# Patient Record
Sex: Male | Born: 1960
Health system: Southern US, Community
[De-identification: ages and names within clinical notes are randomized; demographics above are authoritative.]

## PROBLEM LIST (undated history)

## (undated) DIAGNOSIS — Z6841 Body Mass Index (BMI) 40.0 and over, adult: Secondary | ICD-10-CM

## (undated) DIAGNOSIS — E785 Hyperlipidemia, unspecified: Secondary | ICD-10-CM

## (undated) DIAGNOSIS — M1990 Unspecified osteoarthritis, unspecified site: Secondary | ICD-10-CM

## (undated) DIAGNOSIS — G4733 Obstructive sleep apnea (adult) (pediatric): Secondary | ICD-10-CM

## (undated) DIAGNOSIS — E119 Type 2 diabetes mellitus without complications: Secondary | ICD-10-CM

## (undated) DIAGNOSIS — U071 COVID-19: Secondary | ICD-10-CM

## (undated) DIAGNOSIS — G4730 Sleep apnea, unspecified: Secondary | ICD-10-CM

## (undated) DIAGNOSIS — M25562 Pain in left knee: Secondary | ICD-10-CM

## (undated) DIAGNOSIS — I10 Essential (primary) hypertension: Secondary | ICD-10-CM

## (undated) DIAGNOSIS — Z9989 Dependence on other enabling machines and devices: Secondary | ICD-10-CM

## (undated) DIAGNOSIS — I890 Lymphedema, not elsewhere classified: Secondary | ICD-10-CM

## (undated) DIAGNOSIS — D649 Anemia, unspecified: Secondary | ICD-10-CM

## (undated) HISTORY — DX: Lymphedema, not elsewhere classified: I89.0

## (undated) HISTORY — DX: COVID-19: U07.1

## (undated) HISTORY — DX: Hyperlipidemia, unspecified: E78.5

## (undated) HISTORY — DX: Morbid (severe) obesity due to excess calories: E66.01

## (undated) HISTORY — PX: OTHER SURGICAL HISTORY: SHX169

## (undated) HISTORY — PX: UMBILICAL HERNIA REPAIR: SHX196

## (undated) HISTORY — DX: Obstructive sleep apnea (adult) (pediatric): Z99.89

## (undated) HISTORY — DX: Pain in left knee: M25.562

## (undated) HISTORY — DX: Essential (primary) hypertension: I10

## (undated) HISTORY — DX: Body Mass Index (BMI) 40.0 and over, adult: Z684

## (undated) HISTORY — DX: Unspecified osteoarthritis, unspecified site: M19.90

## (undated) HISTORY — DX: Anemia, unspecified: D64.9

## (undated) HISTORY — DX: Obstructive sleep apnea (adult) (pediatric): G47.33

## (undated) HISTORY — DX: Sleep apnea, unspecified: G47.30

## (undated) HISTORY — PX: HERNIA REPAIR: SHX51

---

## 2016-09-05 ENCOUNTER — Telehealth: Payer: Self-pay | Admitting: *Deleted

## 2016-09-05 ENCOUNTER — Encounter: Payer: Self-pay | Admitting: Family Medicine

## 2016-09-05 ENCOUNTER — Ambulatory Visit (INDEPENDENT_AMBULATORY_CARE_PROVIDER_SITE_OTHER): Payer: 59 | Admitting: Family Medicine

## 2016-09-05 VITALS — BP 126/78 | HR 86 | Temp 98.3°F | Ht 69.0 in | Wt 353.2 lb

## 2016-09-05 DIAGNOSIS — Z6841 Body Mass Index (BMI) 40.0 and over, adult: Secondary | ICD-10-CM

## 2016-09-05 DIAGNOSIS — Z9989 Dependence on other enabling machines and devices: Secondary | ICD-10-CM

## 2016-09-05 DIAGNOSIS — I499 Cardiac arrhythmia, unspecified: Secondary | ICD-10-CM

## 2016-09-05 DIAGNOSIS — Z0000 Encounter for general adult medical examination without abnormal findings: Secondary | ICD-10-CM | POA: Diagnosis not present

## 2016-09-05 DIAGNOSIS — Z0001 Encounter for general adult medical examination with abnormal findings: Secondary | ICD-10-CM

## 2016-09-05 DIAGNOSIS — G4733 Obstructive sleep apnea (adult) (pediatric): Secondary | ICD-10-CM | POA: Insufficient documentation

## 2016-09-05 DIAGNOSIS — I10 Essential (primary) hypertension: Secondary | ICD-10-CM | POA: Insufficient documentation

## 2016-09-05 LAB — CBC
HCT: 47.3 % (ref 39.0–52.0)
Hemoglobin: 16.1 g/dL (ref 13.0–17.0)
MCHC: 34 g/dL (ref 30.0–36.0)
MCV: 91.5 fl (ref 78.0–100.0)
Platelets: 306 10*3/uL (ref 150.0–400.0)
RBC: 5.17 Mil/uL (ref 4.22–5.81)
RDW: 13.5 % (ref 11.5–15.5)
WBC: 8.1 10*3/uL (ref 4.0–10.5)

## 2016-09-05 LAB — COMPREHENSIVE METABOLIC PANEL
ALT: 37 U/L (ref 0–53)
AST: 23 U/L (ref 0–37)
Albumin: 4.3 g/dL (ref 3.5–5.2)
Alkaline Phosphatase: 44 U/L (ref 39–117)
BUN: 18 mg/dL (ref 6–23)
CO2: 27 mEq/L (ref 19–32)
Calcium: 9.5 mg/dL (ref 8.4–10.5)
Chloride: 105 mEq/L (ref 96–112)
Creatinine, Ser: 0.91 mg/dL (ref 0.40–1.50)
GFR: 91.65 mL/min (ref >60.00)
Glucose, Bld: 126 mg/dL — ABNORMAL HIGH (ref 70–99)
Potassium: 4.4 mEq/L (ref 3.5–5.1)
Sodium: 138 mEq/L (ref 135–145)
Total Bilirubin: 0.5 mg/dL (ref 0.2–1.2)
Total Protein: 6.7 g/dL (ref 6.0–8.3)

## 2016-09-05 LAB — LIPID PANEL
Cholesterol: 171 mg/dL (ref 0–200)
HDL: 37.1 mg/dL — ABNORMAL LOW (ref >39.00)
LDL Cholesterol: 111 mg/dL — ABNORMAL HIGH (ref 0–99)
NonHDL: 134.09
Total CHOL/HDL Ratio: 5
Triglycerides: 113 mg/dL (ref 0.0–149.0)
VLDL: 22.6 mg/dL (ref 0.0–40.0)

## 2016-09-05 LAB — TSH: TSH: 1.59 u[IU]/mL (ref 0.35–4.50)

## 2016-09-05 LAB — HEMOGLOBIN A1C: Hgb A1c MFr Bld: 6.2 % (ref 4.6–6.5)

## 2016-09-05 MED ORDER — LISINOPRIL 20 MG PO TABS: 20.0000 mg | ORAL_TABLET | Freq: Every day | ORAL | 3 refills | Status: DC

## 2016-09-05 NOTE — Telephone Encounter (Signed)
Refill sent.

## 2016-09-05 NOTE — Assessment & Plan Note (Signed)
Declined immunizations. Colonoscopy up-to-date. Labs today. Discussed diet and exercise and need for weight loss. Also briefly discussed bariatric surgery. Handout given. **Irregular heartbeat noted on exam. As a result, EKG was obtained and revealed PVCs.

## 2016-09-05 NOTE — Progress Notes (Signed)
Pre visit review using our clinic review tool, if applicable. No additional management support is needed unless otherwise documented below in the visit note. 

## 2016-09-05 NOTE — Patient Instructions (Signed)
When you labs return we can discuss weight loss medication (to ensure that your thyroid is not playing a role).  Consider bariatric surgery.  Take care  Dr. Adriana Simas    Health Maintenance, Male A healthy lifestyle and preventive care is important for your health and wellness. Ask your health care provider about what schedule of regular examinations is right for you. What should I know about weight and diet?  Eat a Healthy Diet  Eat plenty of vegetables, fruits, whole grains, low-fat dairy products, and lean protein.  Do not eat a lot of foods high in solid fats, added sugars, or salt. Maintain a Healthy Weight  Regular exercise can help you achieve or maintain a healthy weight. You should:  Do at least 150 minutes of exercise each week. The exercise should increase your heart rate and make you sweat (moderate-intensity exercise).  Do strength-training exercises at least twice a week. Watch Your Levels of Cholesterol and Blood Lipids  Have your blood tested for lipids and cholesterol every 5 years starting at 56 years of age. If you are at high risk for heart disease, you should start having your blood tested when you are 56 years old. You may need to have your cholesterol levels checked more often if:  Your lipid or cholesterol levels are high.  You are older than 56 years of age.  You are at high risk for heart disease. What should I know about cancer screening? Many types of cancers can be detected early and may often be prevented. Lung Cancer  You should be screened every year for lung cancer if:  You are a current smoker who has smoked for at least 30 years.  You are a former smoker who has quit within the past 15 years.  Talk to your health care provider about your screening options, when you should start screening, and how often you should be screened. Colorectal Cancer  Routine colorectal cancer screening usually begins at 56 years of age and should be repeated every  5-10 years until you are 56 years old. You may need to be screened more often if early forms of precancerous polyps or small growths are found. Your health care provider may recommend screening at an earlier age if you have risk factors for colon cancer.  Your health care provider may recommend using home test kits to check for hidden blood in the stool.  A small camera at the end of a tube can be used to examine your colon (sigmoidoscopy or colonoscopy). This checks for the earliest forms of colorectal cancer. Prostate and Testicular Cancer  Depending on your age and overall health, your health care provider may do certain tests to screen for prostate and testicular cancer.  Talk to your health care provider about any symptoms or concerns you have about testicular or prostate cancer. Skin Cancer  Check your skin from head to toe regularly.  Tell your health care provider about any new moles or changes in moles, especially if:  There is a change in a mole's size, shape, or color.  You have a mole that is larger than a pencil eraser.  Always use sunscreen. Apply sunscreen liberally and repeat throughout the day.  Protect yourself by wearing long sleeves, pants, a wide-brimmed hat, and sunglasses when outside. What should I know about heart disease, diabetes, and high blood pressure?  If you are 68-87 years of age, have your blood pressure checked every 3-5 years. If you are 3 years of age  or older, have your blood pressure checked every year. You should have your blood pressure measured twice-once when you are at a hospital or clinic, and once when you are not at a hospital or clinic. Record the average of the two measurements. To check your blood pressure when you are not at a hospital or clinic, you can use:  An automated blood pressure machine at a pharmacy.  A home blood pressure monitor.  Talk to your health care provider about your target blood pressure.  If you are between  34-63 years old, ask your health care provider if you should take aspirin to prevent heart disease.  Have regular diabetes screenings by checking your fasting blood sugar level.  If you are at a normal weight and have a low risk for diabetes, have this test once every three years after the age of 57.  If you are overweight and have a high risk for diabetes, consider being tested at a younger age or more often.  A one-time screening for abdominal aortic aneurysm (AAA) by ultrasound is recommended for men aged 53-75 years who are current or former smokers. What should I know about preventing infection? Hepatitis B  If you have a higher risk for hepatitis B, you should be screened for this virus. Talk with your health care provider to find out if you are at risk for hepatitis B infection. Hepatitis C  Blood testing is recommended for:  Everyone born from [birth year] through 1965.  Anyone with known risk factors for hepatitis C. Sexually Transmitted Diseases (STDs)  You should be screened each year for STDs including gonorrhea and chlamydia if:  You are sexually active and are younger than 57 years of age.  You are older than 56 years of age and your health care provider tells you that you are at risk for this type of infection.  Your sexual activity has changed since you were last screened and you are at an increased risk for chlamydia or gonorrhea. Ask your health care provider if you are at risk.  Talk with your health care provider about whether you are at high risk of being infected with HIV. Your health care provider may recommend a prescription medicine to help prevent HIV infection. What else can I do?  Schedule regular health, dental, and eye exams.  Stay current with your vaccines (immunizations).  Do not use any tobacco products, such as cigarettes, chewing tobacco, and e-cigarettes. If you need help quitting, ask your health care provider.  Limit alcohol intake to no more than 2  drinks per day. One drink equals 12 ounces of beer, 5 ounces of wine, or 1 ounces of hard liquor.  Do not use street drugs.  Do not share needles.  Ask your health care provider for help if you need support or information about quitting drugs.  Tell your health care provider if you often feel depressed.  Tell your health care provider if you have ever been abused or do not feel safe at home. This information is not intended to replace advice given to you by your health care provider. Make sure you discuss any questions you have with your health care provider. Document Released: 11/16/2007 Document Revised: 01/17/2016 Document Reviewed: 02/21/2015 Elsevier Interactive Patient Education  2017 [employer].

## 2016-09-05 NOTE — Telephone Encounter (Signed)
Requested medication refill for :Lisinopril  Pharmacy:[facility] [address]  Please Contact Pt when ready or sent to Pharmacy:   [phone]

## 2016-09-05 NOTE — Progress Notes (Signed)
Subjective:  Patient ID: John Orozco, male    DOB: [birth year]  Age: 56 y.o. MRN: [id number]  CC: Establish care/physical  HPI John Orozco is a 56 y.o. male presents to the clinic today to establish care. He is in need of a physical.  Preventative Healthcare  Colonoscopy: Up to date. Done in 2015.  Immunizations  Tetanus - Declines.  Pneumococcal - Not indicated.  Flu - Declines.  Zoster - Candidate for.  Hepatitis C screening - Candidate for.  Labs: Labs today.  Alcohol use: No.  Smoking/tobacco use: No.  PMH, Surgical Hx, Family Hx, Social History reviewed and updated as below.  Past Medical History:  Diagnosis Date  . Essential hypertension   . Morbid obesity with BMI of 50.0-59.9, adult (HCC)   . OSA on CPAP    Past Surgical History:  Procedure Laterality Date  . UMBILICAL HERNIA REPAIR     Family History  Problem Relation Age of Onset  . Hypertension Mother   . Heart disease Mother    Social History  Substance Use Topics  . Smoking status: Never Smoker  . Smokeless tobacco: Never Used  . Alcohol use No   Review of Systems General: Denies unexplained weight loss, fever. Skin: Denies new or changing mole, sore/wound that won't heal. ENT: Trouble hearing, ringing in the ears, sores in the mouth, hoarseness, trouble swallowing. Eyes: Denies trouble seeing/visual disturbance. Heart/CV: Denies chest pain, shortness of breath, edema, palpitations. Lungs/Resp: Denies cough, shortness of breath, hemoptysis. Abd/GI: Denies nausea, vomiting, diarrhea, constipation, abdominal pain, hematochezia, melena. GU: Denies dysuria, incontinence, hematuria, urinary frequency, difficulty starting/keeping stream, penile discharge, sexual difficulty, lump in testicles. MSK: Denies joint pain/swelling, myalgias. Neuro: Denies headaches, weakness, numbness, dizziness, syncope. Psych: Denies sadness, anxiety, stress, memory difficulty. Endocrine: Denies polyuria and  polydipsia.  Objective:   Today's Vitals: BP 126/78   Pulse 86   Temp 98.3 F (36.8 C) (Oral)   Ht  (1.753 m)   Wt (!) 353 lb 4 oz (160.2 kg)   SpO2 97%   BMI 52.17 kg/m   Physical Exam  Constitutional: He is oriented to person, place, and time. He appears well-developed. No distress.  Morbidly obese.  HENT:  Head: Normocephalic and atraumatic.  Mouth/Throat: Oropharynx is clear and moist.  Eyes: Conjunctivae are normal.  Neck: Neck supple.  Cardiovascular: Normal rate.   Irregular.  Pulmonary/Chest: Effort normal and breath sounds normal. He has no wheezes. He has no rales.  Abdominal: Soft. He exhibits no distension. There is no tenderness.  Musculoskeletal: Normal range of motion.  Neurological: He is alert and oriented to person, place, and time.  Skin:  Hyperpigmentation lower extremities consistent with chronic venous insufficiency.  Psychiatric: He has a normal mood and affect.  Vitals reviewed.  Assessment & Plan:   Problem List Items Addressed This Visit    Encounter for health maintenance examination with abnormal findings - Primary    Declined immunizations. Colonoscopy up-to-date. Labs today. Discussed diet and exercise and need for weight loss. Also briefly discussed bariatric surgery. Handout given. **Irregular heartbeat noted on exam. As a result, EKG was obtained and revealed PVCs.      Relevant Orders   CBC   Hemoglobin A1c   Comprehensive metabolic panel   Lipid panel   TSH    Other Visit Diagnoses    Irregular heartbeat       Relevant Orders   EKG 12-Lead (Completed)     Follow-up: 6 months to 1 year  [place]

## 2016-11-19 ENCOUNTER — Encounter: Payer: Self-pay | Admitting: Family Medicine

## 2016-11-19 ENCOUNTER — Ambulatory Visit (INDEPENDENT_AMBULATORY_CARE_PROVIDER_SITE_OTHER): Payer: 59 | Admitting: Family Medicine

## 2016-11-19 DIAGNOSIS — I10 Essential (primary) hypertension: Secondary | ICD-10-CM | POA: Diagnosis not present

## 2016-11-19 DIAGNOSIS — R0981 Nasal congestion: Secondary | ICD-10-CM | POA: Diagnosis not present

## 2016-11-19 MED ORDER — LISINOPRIL 20 MG PO TABS: 30.0000 mg | ORAL_TABLET | Freq: Every day | ORAL | 3 refills | Status: DC

## 2016-11-19 MED ORDER — FLUTICASONE PROPIONATE 50 MCG/ACT NA SUSP: 2.0000 | Freq: Every day | NASAL | 6 refills | Status: DC

## 2016-11-19 MED ORDER — CETIRIZINE HCL 10 MG PO TABS: 10.0000 mg | ORAL_TABLET | Freq: Every day | ORAL | 11 refills | Status: DC

## 2016-11-19 NOTE — Assessment & Plan Note (Signed)
New problem. Likely allergic or viral. Flonase and Zyrtec.

## 2016-11-19 NOTE — Patient Instructions (Signed)
Lisinopril 30 mg daily (1.5 tablets).  Flonase and Zyrtec for allergy.  Lab in 7-10 days.  Follow up in 3-6 months.  Take care  Dr. Adriana Simasook

## 2016-11-19 NOTE — Progress Notes (Signed)
Subjective:  Patient ID: John Orozco, male    DOB: [birth year]  Age: 56 y.o. MRN: [id number]  CC: Elevated BP, allergies  HPI:  56 year old male with hypertension, OSA, morbid obesity presents with the above complaints.  Hypertension  Patient reports that his blood pressure has recently been elevated. The highest he has seen was 151/100.  He endorses compliance with his lisinopril.  He states his blood pressure seems to be worse in the evening.  He states he uses a home arm cuff.  He thinks he needs an increase in his medication.  Allergies  Patient reports recent sinus congestion, watery eyes.  No known inciting factor.  No medications or interventions tried.  No known exacerbating or relieving factors.  Mild to moderate in severity.  No associated fevers or chills.  He believes that this is allergic in nature.  Social Hx   Social History   Social History  . Marital status: Married    Spouse name: N/A  . Number of children: N/A  . Years of education: N/A   Social History Main Topics  . Smoking status: Never Smoker  . Smokeless tobacco: Never Used  . Alcohol use No  . Drug use: No  . Sexual activity: Yes    Partners: Female   Other Topics Concern  . None   Social History Narrative  . None    Review of Systems  HENT: Positive for congestion and sinus pressure.   Eyes:       Eye watering.   Objective:  BP 128/78 (BP Location: Right Arm, Cuff Size: Large)   Pulse 91   Temp 98.7 F (37.1 C) (Oral)   Resp 12   Wt (!) 353 lb 6.4 oz (160.3 kg)   SpO2 98%   BMI 52.19 kg/m   BP/Weight 11/19/2016 09/05/2016  Systolic BP 128 126  Diastolic BP 78 78  Wt. (Lbs) 353.4 353.25  BMI 52.19 52.17   Physical Exam  Constitutional: He is oriented to person, place, and time. He appears well-developed. No distress.  HENT:  Mouth/Throat: Oropharynx is clear and moist.  Eyes: Conjunctivae are normal. Right eye exhibits no discharge. Left eye exhibits no  discharge.  Cardiovascular: Normal rate and regular rhythm.   No murmur heard. Pulmonary/Chest: Effort normal. He has no wheezes. He has no rales.  Neurological: He is alert and oriented to person, place, and time.  Psychiatric: He has a normal mood and affect.  Vitals reviewed.   Lab Results  Component Value Date   WBC 8.1 09/05/2016   HGB 16.1 09/05/2016   HCT 47.3 09/05/2016   PLT 306.0 09/05/2016   GLUCOSE 126 (H) 09/05/2016   CHOL 171 09/05/2016   TRIG 113.0 09/05/2016   HDL 37.10 (L) 09/05/2016   LDLCALC 111 (H) 09/05/2016   ALT 37 09/05/2016   AST 23 09/05/2016   NA 138 09/05/2016   K 4.4 09/05/2016   CL 105 09/05/2016   CREATININE 0.91 09/05/2016   BUN 18 09/05/2016   CO2 27 09/05/2016   TSH 1.59 09/05/2016   HGBA1C 6.2 09/05/2016    Assessment & Plan:   Problem List Items Addressed This Visit    Sinus congestion    New problem. Likely allergic or viral. Flonase and Zyrtec.      Essential hypertension    Elevated at home per patient. BP at goal today. Increasing Lisinopril to 30 mg daily (per patient preference).      Relevant Medications   aspirin 81 MG  chewable tablet   lisinopril (PRINIVIL,ZESTRIL) 20 MG tablet      Meds ordered this encounter  Medications  . aspirin 81 MG chewable tablet    Sig: Chew 81 mg by mouth daily.  Marland Kitchen lisinopril (PRINIVIL,ZESTRIL) 20 MG tablet    Sig: Take 1.5 tablets (30 mg total) by mouth daily.    Dispense:  135 tablet    Refill:  3  . fluticasone (FLONASE) 50 MCG/ACT nasal spray    Sig: Place 2 sprays into both nostrils daily.    Dispense:  16 g    Refill:  6  . cetirizine (ZYRTEC) 10 MG tablet    Sig: Take 1 tablet (10 mg total) by mouth daily.    Dispense:  30 tablet    Refill:  11    Follow-up: 3-6 months  Doil Kamara Adriana Simas DO [facility]

## 2016-11-19 NOTE — Assessment & Plan Note (Signed)
Elevated at home per patient. BP at goal today. Increasing Lisinopril to 30 mg daily (per patient preference).

## 2016-11-19 NOTE — Progress Notes (Signed)
Pre-visit discussion using our clinic review tool. No additional management support is needed unless otherwise documented below in the visit note.  

## 2017-04-11 ENCOUNTER — Ambulatory Visit: Payer: Self-pay

## 2017-04-11 NOTE — Telephone Encounter (Signed)
Pt given advice per protocol but wishes to see PCP. [facility]. Pt states he will see [facility]. Appt made 04/17/17 @ 0915 with Dr. Ermalene SearingBedsole.  Reason for Disposition . [1] Sinus congestion as part of a cold AND [2] present < 10 days  Answer Assessment - Initial Assessment Questions 1. LOCATION: "Where does it hurt?"      Underneath right earlobe, headache 2. ONSET: "When did the sinus pain start?"  (e.g., hours, days)      Monday 3. SEVERITY: "How bad is the pain?"   (Scale 1-10; mild, moderate or severe)   - MILD (1-3): doesn't interfere with normal activities    - MODERATE (4-7): interferes with normal activities (e.g., work or school) or awakens from sleep   - SEVERE (8-10): excruciating pain and patient unable to do any normal activities        4 constant headache that subsided with Ibuprofen  4. RECURRENT SYMPTOM: "Have you ever had sinus problems before?" If so, ask: "When was the last time?" and "What happened that time?"     H/o sinus infections pt not sure when States when the seasons change 5. NASAL CONGESTION: "Is the nose blocked?" If so, ask, "Can you open it or must you breathe through the mouth?"     Yes wakes him up coughing into the CPAP mask 6. NASAL DISCHARGE: "Do you have discharge from your nose?" If so ask, "What color?"     Yes  Didn't note what color 7. FEVER: "Do you have a fever?" If so, ask: "What is it, how was it measured, and when did it start?"      No fever 8. OTHER SYMPTOMS: "Do you have any other symptoms?" (e.g., sore throat, cough, earache, difficulty breathing)     Cough earache scratchy and dry throat 9. PREGNANCY: "Is there any chance you are pregnant?" "When was your last menstrual period?"     N/a  Protocols used: SINUS PAIN OR CONGESTION-A-AH

## 2017-04-17 ENCOUNTER — Ambulatory Visit: Payer: 59 | Admitting: Family Medicine

## 2017-06-10 ENCOUNTER — Ambulatory Visit: Payer: Self-pay | Admitting: *Deleted

## 2017-06-10 NOTE — Telephone Encounter (Signed)
Reason for Disposition . [1] Sinus congestion as part of a cold AND [2] present < 10 days  Answer Assessment - Initial Assessment Questions 1. LOCATION: "Where does it hurt?"      Scratchy and sore throat 2. ONSET: "When did the sinus pain start?"  (e.g., hours, days)      Friday 3. SEVERITY: "How bad is the pain?"   (Scale 1-10; mild, moderate or severe)   - MILD (1-3): doesn't interfere with normal activities    - MODERATE (4-7): interferes with normal activities (e.g., work or school) or awakens from sleep   - SEVERE (8-10): excruciating pain and patient unable to do any normal activities        No complaints of pain 4. RECURRENT SYMPTOM: "Have you ever had sinus problems before?" If so, ask: "When was the last time?" and "What happened that time?"      Yes, in November 5. NASAL CONGESTION: "Is the nose blocked?" If so, ask, "Can you open it or must you breathe through the mouth?"     Yes, has been using nasal decongestant 6. NASAL DISCHARGE: "Do you have discharge from your nose?" If so ask, "What color?"     Yes, clear 7. FEVER: "Do you have a fever?" If so, ask: "What is it, how was it measured, and when did it start?"      No  8. OTHER SYMPTOMS: "Do you have any other symptoms?" (e.g., sore throat, cough, earache, difficulty breathing)     Sore throat, cough, feeling tired  Protocols used: SINUS PAIN OR CONGESTION-A-AH  Pt having complaints of scratchy and sore throat since Friday. Pt states that he has been coughing up a lot of clear phlegm and has been feeling tired. Pt states he has not had a fever that he is aware of, but he has not checked his temperature. Pt has been taking Mucinex, Tylenol cold OTC meds and using nasal decongestant with little relief. Bethann Berkshirerisha, flow coordinator at office contacted to see if any availability for the pt to be seen this week by any provider at the office, but none available, so pt was advised [facility]. Appt  scheduled for 2/11 to establish care due to being a former pt of Dr. Adriana Simasook. Pt verbalized understanding.

## 2017-07-14 ENCOUNTER — Ambulatory Visit (INDEPENDENT_AMBULATORY_CARE_PROVIDER_SITE_OTHER): Payer: Self-pay | Admitting: Internal Medicine

## 2017-07-14 ENCOUNTER — Encounter: Payer: Self-pay | Admitting: Internal Medicine

## 2017-07-14 VITALS — BP 136/84 | HR 86 | Temp 98.0°F | Ht 70.0 in | Wt 359.6 lb

## 2017-07-14 DIAGNOSIS — Z13818 Encounter for screening for other digestive system disorders: Secondary | ICD-10-CM

## 2017-07-14 DIAGNOSIS — Z23 Encounter for immunization: Secondary | ICD-10-CM

## 2017-07-14 DIAGNOSIS — I890 Lymphedema, not elsewhere classified: Secondary | ICD-10-CM

## 2017-07-14 DIAGNOSIS — Z13220 Encounter for screening for lipoid disorders: Secondary | ICD-10-CM

## 2017-07-14 DIAGNOSIS — Z125 Encounter for screening for malignant neoplasm of prostate: Secondary | ICD-10-CM

## 2017-07-14 DIAGNOSIS — J321 Chronic frontal sinusitis: Secondary | ICD-10-CM

## 2017-07-14 DIAGNOSIS — Z9989 Dependence on other enabling machines and devices: Secondary | ICD-10-CM

## 2017-07-14 DIAGNOSIS — Z1329 Encounter for screening for other suspected endocrine disorder: Secondary | ICD-10-CM

## 2017-07-14 DIAGNOSIS — Z6841 Body Mass Index (BMI) 40.0 and over, adult: Secondary | ICD-10-CM

## 2017-07-14 DIAGNOSIS — R0981 Nasal congestion: Secondary | ICD-10-CM

## 2017-07-14 DIAGNOSIS — I10 Essential (primary) hypertension: Secondary | ICD-10-CM

## 2017-07-14 DIAGNOSIS — Z1159 Encounter for screening for other viral diseases: Secondary | ICD-10-CM

## 2017-07-14 DIAGNOSIS — G4733 Obstructive sleep apnea (adult) (pediatric): Secondary | ICD-10-CM

## 2017-07-14 DIAGNOSIS — R7303 Prediabetes: Secondary | ICD-10-CM

## 2017-07-14 MED ORDER — AZELASTINE HCL 0.15 % NA SOLN: 1.0000 | Freq: Every day | NASAL | 11 refills | Status: DC | PRN

## 2017-07-14 MED ORDER — SALINE SPRAY 0.65 % NA SOLN: 1.0000 | Freq: Every day | NASAL | 11 refills | Status: DC | PRN

## 2017-07-14 MED ORDER — CETIRIZINE HCL 10 MG PO TABS: 10.0000 mg | ORAL_TABLET | Freq: Every day | ORAL | 3 refills | Status: DC

## 2017-07-14 MED ORDER — FLUTICASONE PROPIONATE 50 MCG/ACT NA SUSP: 1.0000 | Freq: Every day | NASAL | 11 refills | Status: DC

## 2017-07-14 MED ORDER — LISINOPRIL 30 MG PO TABS
30.0000 mg | ORAL_TABLET | Freq: Every day | ORAL | 1 refills | Status: DC
Start: 2017-07-14 — End: 2018-03-05

## 2017-07-14 NOTE — Patient Instructions (Signed)
Please try  1. Nasal saline 2. Flonase 3 .Azestaline nasal sprays   Call back if you would like a CT scan of your sinuses  Please schedule fasting labs no food x 12 hours around 09/05/17 and f/u with me 1 week later  Take care   Sinusitis, Adult Sinusitis is soreness and inflammation of your sinuses. Sinuses are hollow spaces in the bones around your face. Your sinuses are located:  Around your eyes.  In the middle of your forehead.  Behind your nose.  In your cheekbones.  Your sinuses and nasal passages are lined with a stringy fluid (mucus). Mucus normally drains out of your sinuses. When your nasal tissues become inflamed or swollen, the mucus can become trapped or blocked so air cannot flow through your sinuses. This allows bacteria, viruses, and funguses to grow, which leads to infection. Sinusitis can develop quickly and last for 7?10 days (acute) or for more than 12 weeks (chronic). Sinusitis often develops after a cold. What are the causes? This condition is caused by anything that creates swelling in the sinuses or stops mucus from draining, including:  Allergies.  Asthma.  Bacterial or viral infection.  Abnormally shaped bones between the nasal passages.  Nasal growths that contain mucus (nasal polyps).  Narrow sinus openings.  Pollutants, such as chemicals or irritants in the air.  A foreign object stuck in the nose.  A fungal infection. This is rare.  What increases the risk? The following factors may make you more likely to develop this condition:  Having allergies or asthma.  Having had a recent cold or respiratory tract infection.  Having structural deformities or blockages in your nose or sinuses.  Having a weak immune system.  Doing a lot of swimming or diving.  Overusing nasal sprays.  Smoking.  What are the signs or symptoms? The main symptoms of this condition are pain and a feeling of pressure around the affected sinuses. Other symptoms  include:  Upper toothache.  Earache.  Headache.  Bad breath.  Decreased sense of smell and taste.  A cough that may get worse at night.  Fatigue.  Fever.  Thick drainage from your nose. The drainage is often green and it may contain pus (purulent).  Stuffy nose or congestion.  Postnasal drip. This is when extra mucus collects in the throat or back of the nose.  Swelling and warmth over the affected sinuses.  Sore throat.  Sensitivity to light.  How is this diagnosed? This condition is diagnosed based on symptoms, a medical history, and a physical exam. To find out if your condition is acute or chronic, your health care provider may:  Look in your nose for signs of nasal polyps.  Tap over the affected sinus to check for signs of infection.  View the inside of your sinuses using an imaging device that has a light attached (endoscope).  If your health care provider suspects that you have chronic sinusitis, you may also:  Be tested for allergies.  Have a sample of mucus taken from your nose (nasal culture) and checked for bacteria.  Have a mucus sample examined to see if your sinusitis is related to an allergy.  If your sinusitis does not respond to treatment and it lasts longer than 8 weeks, you may have an MRI or CT scan to check your sinuses. These scans also help to determine how severe your infection is. In rare cases, a bone biopsy may be done to rule out more serious types  of fungal sinus disease. How is this treated? Treatment for sinusitis depends on the cause and whether your condition is chronic or acute. If a virus is causing your sinusitis, your symptoms will go away on their own within 10 days. You may be given medicines to relieve your symptoms, including:  Topical nasal decongestants. They shrink swollen nasal passages and let mucus drain from your sinuses.  Antihistamines. These drugs block inflammation that is triggered by allergies. This can help  to ease swelling in your nose and sinuses.  Topical nasal corticosteroids. These are nasal sprays that ease inflammation and swelling in your nose and sinuses.  Nasal saline washes. These rinses can help to get rid of thick mucus in your nose.  If your condition is caused by bacteria, you will be given an antibiotic medicine. If your condition is caused by a fungus, you will be given an antifungal medicine. Surgery may be needed to correct underlying conditions, such as narrow nasal passages. Surgery may also be needed to remove polyps. Follow these instructions at home: Medicines  Take, use, or apply over-the-counter and prescription medicines only as told by your health care provider. These may include nasal sprays.  If you were prescribed an antibiotic medicine, take it as told by your health care provider. Do not stop taking the antibiotic even if you start to feel better. Hydrate and Humidify  Drink enough water to keep your urine clear or pale yellow. Staying hydrated will help to thin your mucus.  Use a cool mist humidifier to keep the humidity level in your home above 50%.  Inhale steam for 10-15 minutes, 3-4 times a day or as told by your health care provider. You can do this in the bathroom while a hot shower is running.  Limit your exposure to cool or dry air. Rest  Rest as much as possible.  Sleep with your head raised (elevated).  Make sure to get enough sleep each night. General instructions  Apply a warm, moist washcloth to your face 3-4 times a day or as told by your health care provider. This will help with discomfort.  Wash your hands often with soap and water to reduce your exposure to viruses and other germs. If soap and water are not available, use hand sanitizer.  Do not smoke. Avoid being around people who are smoking (secondhand smoke).  Keep all follow-up visits as told by your health care provider. This is important. Contact a health care provider  if:  You have a fever.  Your symptoms get worse.  Your symptoms do not improve within 10 days. Get help right away if:  You have a severe headache.  You have persistent vomiting.  You have pain or swelling around your face or eyes.  You have vision problems.  You develop confusion.  Your neck is stiff.  You have trouble breathing. This information is not intended to replace advice given to you by your health care provider. Make sure you discuss any questions you have with your health care provider. Document Released: 05/20/2005 Document Revised: 01/14/2016 Document Reviewed: 03/15/2015 Elsevier Interactive Patient Education  [employer].

## 2017-07-14 NOTE — Progress Notes (Signed)
Pre visit review using our clinic review tool, if applicable. No additional management support is needed unless otherwise documented below in the visit note. 

## 2017-07-15 ENCOUNTER — Encounter: Payer: Self-pay | Admitting: Internal Medicine

## 2017-07-15 DIAGNOSIS — I890 Lymphedema, not elsewhere classified: Secondary | ICD-10-CM | POA: Insufficient documentation

## 2017-07-15 NOTE — Progress Notes (Signed)
Chief Complaint  Patient presents with  . Follow-up    transfer from Dr. Adriana Simas   Follow u p 1. He saw urgent care ~10 days ago for sinusitis with sinus pressure frontal esp and feels like head filling up and eyes watery. He was given 10 days Augmentin, azestaline nasal. He still c/o PND and note being able to smell/breath out of right nose. This is his 2nd flare since 06/2017 and he did not get evaluated for the 06/2017 flare. He also reports ear pain related to sinus issues.  He has never been smoker no h/o allergies except 10 years ago he was allergic to rodents like gerbils/rats. He does have a 57 y.o hypoallergenic dog. He never tried [employer]. He feels slightly better but not 100% 2. HTN controlled on Lis 30 mg qd  3. Chronic lymphedema improved with cpap and compression stockings per pt  4. OSA on cpap and benefiting    Review of Systems  Constitutional: Negative for weight loss.  HENT: Positive for ear pain and sinus pain.        +PND  Eyes: Negative for blurred vision.  Respiratory: Negative for cough.   Cardiovascular:       H/o lymphedema controlled with compression stockings   Gastrointestinal: Negative for abdominal pain.  Musculoskeletal: Negative for falls.  Skin: Negative for rash.  Neurological: Positive for headaches.  Psychiatric/Behavioral: Negative for memory loss.   Past Medical History:  Diagnosis Date  . Essential hypertension   . Lymphedema   . Morbid obesity with BMI of 50.0-59.9, adult (HCC)   . OSA on CPAP    Past Surgical History:  Procedure Laterality Date  . UMBILICAL HERNIA REPAIR     Family History  Problem Relation Age of Onset  . Hypertension Mother   . Heart disease Mother    Social History   Socioeconomic History  . Marital status: Married    Spouse name: Not on file  . Number of children: Not on file  . Years of education: Not on file  . Highest education level: Not on file  Social Needs  . Financial resource strain: Not on file  .  Food insecurity - worry: Not on file  . Food insecurity - inability: Not on file  . Transportation needs - medical: Not on file  . Transportation needs - non-medical: Not on file  Occupational History  . Not on file  Tobacco Use  . Smoking status: Never Smoker  . Smokeless tobacco: Never Used  Substance and Sexual Activity  . Alcohol use: No  . Drug use: No  . Sexual activity: Yes    Partners: Female  Other Topics Concern  . Not on file  Social History Narrative   Married    Kids    Current Meds  Medication Sig  . aspirin 81 MG chewable tablet Chew 81 mg by mouth daily.  Marland Kitchen lisinopril (PRINIVIL,ZESTRIL) 30 MG tablet Take 1 tablet (30 mg total) by mouth daily.  . [DISCONTINUED] lisinopril (PRINIVIL,ZESTRIL) 20 MG tablet Take 1.5 tablets (30 mg total) by mouth daily.   No Known Allergies No results found for this or any previous visit (from the past 2160 hour(s)). Objective  Body mass index is 51.6 kg/m. Wt Readings from Last 3 Encounters:  07/14/17 (!) 359 lb 9.6 oz (163.1 kg)  11/19/16 (!) 353 lb 6.4 oz (160.3 kg)  09/05/16 (!) 353 lb 4 oz (160.2 kg)   Temp Readings from Last 3 Encounters:  07/14/17 98 F (  36.7 C) (Oral)  11/19/16 98.7 F (37.1 C) (Oral)  09/05/16 98.3 F (36.8 C) (Oral)   BP Readings from Last 3 Encounters:  07/14/17 136/84  11/19/16 128/78  09/05/16 126/78   Pulse Readings from Last 3 Encounters:  07/14/17 86  11/19/16 91  09/05/16 86   O2 sat room air 97%  Physical Exam  Constitutional: He is oriented to person, place, and time and well-developed, well-nourished, and in no distress. Vital signs are normal.  HENT:  Head: Normocephalic and atraumatic.  Nose: Right sinus exhibits frontal sinus tenderness. Left sinus exhibits frontal sinus tenderness.  Mouth/Throat: Oropharynx is clear and moist and mucous membranes are normal.  Eyes: Conjunctivae are normal. Pupils are equal, round, and reactive to light.  Cardiovascular: Normal rate,  regular rhythm and normal heart sounds.  Pulmonary/Chest: Effort normal and breath sounds normal.  Neurological: He is alert and oriented to person, place, and time. Gait normal. Gait normal.  Skin: Skin is warm, dry and intact.     Psychiatric: Mood, memory, affect and judgment normal.  Nursing note and vitals reviewed.   Assessment   1. Sinusitis somewhat improved  2. HM 3. Chronic lymphedema  4. OSA on cpap compliant and benefiting from cpap  5. HTN controlled on lis 30 qd  Plan   1.  Trial NS, flonase and azestaline nasal, cont zyrtec qhs  Consider singulair if sxs dont improve  Consider CT sinus if does not improve   2.  Given flu shot today  Tdap had <10 years ago ? When  Check hep B/C status  Will need to check labs CMET, CBC, lipid, UA, TSH, T4, A1C, PSA, hep B/C prior to next visit  Declines HIV testing  Disc exercise to lose wt and healthy diet choices  Never smoker  Colonoscopy had in [place] 5-6 years ago was told 10 years due in 4 years.   3.  Cont compression stockings 4. Cont cpap  5. Cont meds   Provider: Dr. French Anaracy McLean-Scocuzza-[facility]

## 2017-09-15 ENCOUNTER — Other Ambulatory Visit: Payer: 59

## 2018-03-05 ENCOUNTER — Other Ambulatory Visit: Payer: Self-pay | Admitting: Internal Medicine

## 2018-03-05 MED ORDER — LISINOPRIL 30 MG PO TABS: 30.0000 mg | ORAL_TABLET | Freq: Every day | ORAL | 0 refills | Status: DC

## 2018-03-05 NOTE — Telephone Encounter (Signed)
Courtesy refill given. Scheduled  Appt. for 04/17/18  Requested Prescriptions  Pending Prescriptions Disp Refills  . lisinopril (PRINIVIL,ZESTRIL) 30 MG tablet 90 tablet 0    Sig: Take 1 tablet (30 mg total) by mouth daily.     Cardiovascular:  ACE Inhibitors Failed - 03/05/2018  3:14 PM      Failed - Cr in normal range and within 180 days    Creatinine, Ser  Date Value Ref Range Status  09/05/2016 0.91 0.40 - 1.50 mg/dL Final         Failed - K in normal range and within 180 days    Potassium  Date Value Ref Range Status  09/05/2016 4.4 3.5 - 5.1 mEq/L Final         Failed - Valid encounter within last 6 months    Recent Outpatient Visits          7 months ago Frontal sinusitis, unspecified chronicity   [facility] [place] McLean-Scocuzza, Pasty Spillers, MD   1 year ago Essential hypertension   [facility] [place], [place], DO   1 year ago Encounter for health maintenance examination with abnormal findings   [facility] [place], Verdis Frederickson, DO      Future Appointments            In 1 month McLean-Scocuzza, Pasty Spillers, MD [facility], [facility]           Passed - Patient is not pregnant      Passed - Last BP in normal range    BP Readings from Last 1 Encounters:  07/14/17 136/84

## 2018-03-05 NOTE — Telephone Encounter (Signed)
Copied from CRM [id number]. Topic: Quick Communication - Rx Refill/Question >> Mar 05, 2018  3:09 PM Darletta Moll L wrote: Medication: lisinopril (PRINIVIL,ZESTRIL) 30 MG tablet  Has the patient contacted their pharmacy? Yes.   (Agent: If no, request that the patient contact the pharmacy for the refill.) (Agent: If yes, when and what did the pharmacy advise?)  Preferred Pharmacy (with phone number or street name): [facility] [address] [place] - [address] [place] [address] Phone: [phone] Fax: [phone]  Agent: Please be advised that RX refills may take up to 3 business days. We ask that you follow-up with your pharmacy.

## 2018-04-17 ENCOUNTER — Encounter: Payer: Self-pay | Admitting: Internal Medicine

## 2018-04-17 ENCOUNTER — Ambulatory Visit (INDEPENDENT_AMBULATORY_CARE_PROVIDER_SITE_OTHER): Payer: BLUE CROSS/BLUE SHIELD | Admitting: Internal Medicine

## 2018-04-17 VITALS — BP 140/86 | HR 97 | Temp 97.5°F | Ht 70.0 in | Wt 375.6 lb

## 2018-04-17 DIAGNOSIS — I10 Essential (primary) hypertension: Secondary | ICD-10-CM

## 2018-04-17 DIAGNOSIS — Z1329 Encounter for screening for other suspected endocrine disorder: Secondary | ICD-10-CM

## 2018-04-17 DIAGNOSIS — Z1389 Encounter for screening for other disorder: Secondary | ICD-10-CM

## 2018-04-17 DIAGNOSIS — Z125 Encounter for screening for malignant neoplasm of prostate: Secondary | ICD-10-CM

## 2018-04-17 DIAGNOSIS — Z23 Encounter for immunization: Secondary | ICD-10-CM

## 2018-04-17 DIAGNOSIS — E785 Hyperlipidemia, unspecified: Secondary | ICD-10-CM

## 2018-04-17 DIAGNOSIS — Z6841 Body Mass Index (BMI) 40.0 and over, adult: Secondary | ICD-10-CM

## 2018-04-17 DIAGNOSIS — E559 Vitamin D deficiency, unspecified: Secondary | ICD-10-CM

## 2018-04-17 DIAGNOSIS — Z13818 Encounter for screening for other digestive system disorders: Secondary | ICD-10-CM

## 2018-04-17 DIAGNOSIS — M25562 Pain in left knee: Secondary | ICD-10-CM | POA: Diagnosis not present

## 2018-04-17 DIAGNOSIS — J309 Allergic rhinitis, unspecified: Secondary | ICD-10-CM

## 2018-04-17 DIAGNOSIS — R7303 Prediabetes: Secondary | ICD-10-CM

## 2018-04-17 DIAGNOSIS — Z1159 Encounter for screening for other viral diseases: Secondary | ICD-10-CM

## 2018-04-17 MED ORDER — MONTELUKAST SODIUM 10 MG PO TABS: 10.0000 mg | ORAL_TABLET | Freq: Every day | ORAL | 0 refills | Status: DC

## 2018-04-17 MED ORDER — LISINOPRIL 30 MG PO TABS: 30.0000 mg | ORAL_TABLET | Freq: Every day | ORAL | 3 refills | Status: DC

## 2018-04-17 NOTE — Progress Notes (Signed)
Pre visit review using our clinic review tool, if applicable. No additional management support is needed unless otherwise documented below in the visit note. 

## 2018-04-17 NOTE — Patient Instructions (Signed)
Fasting labs asap  F/u in 6 months sooner if needed    Hypertension Hypertension, commonly called high blood pressure, is when the force of blood pumping through the arteries is too strong. The arteries are the blood vessels that carry blood from the heart throughout the body. Hypertension forces the heart to work harder to pump blood and may cause arteries to become narrow or stiff. Having untreated or uncontrolled hypertension can cause heart attacks, strokes, kidney disease, and other problems. A blood pressure reading consists of a higher number over a lower number. Ideally, your blood pressure should be below 120/80. The first ("top") number is called the systolic pressure. It is a measure of the pressure in your arteries as your heart beats. The second ("bottom") number is called the diastolic pressure. It is a measure of the pressure in your arteries as the heart relaxes. What are the causes? The cause of this condition is not known. What increases the risk? Some risk factors for high blood pressure are under your control. Others are not. Factors you can change  Smoking.  Having type 2 diabetes mellitus, high cholesterol, or both.  Not getting enough exercise or physical activity.  Being overweight.  Having too much fat, sugar, calories, or salt (sodium) in your diet.  Drinking too much alcohol. Factors that are difficult or impossible to change  Having chronic kidney disease.  Having a family history of high blood pressure.  Age. Risk increases with age.  Race. You may be at higher risk if you are African-American.  Gender. Men are at higher risk than women before age 41. After age 72, women are at higher risk than men.  Having obstructive sleep apnea.  Stress. What are the signs or symptoms? Extremely high blood pressure (hypertensive crisis) may cause:  Headache.  Anxiety.  Shortness of breath.  Nosebleed.  Nausea and vomiting.  Severe chest  pain.  Jerky movements you cannot control (seizures).  How is this diagnosed? This condition is diagnosed by measuring your blood pressure while you are seated, with your arm resting on a surface. The cuff of the blood pressure monitor will be placed directly against the skin of your upper arm at the level of your heart. It should be measured at least twice using the same arm. Certain conditions can cause a difference in blood pressure between your right and left arms. Certain factors can cause blood pressure readings to be lower or higher than normal (elevated) for a short period of time:  When your blood pressure is higher when you are in a health care provider's office than when you are at home, this is called white coat hypertension. Most people with this condition do not need medicines.  When your blood pressure is higher at home than when you are in a health care provider's office, this is called masked hypertension. Most people with this condition may need medicines to control blood pressure.  If you have a high blood pressure reading during one visit or you have normal blood pressure with other risk factors:  You may be asked to return on a different day to have your blood pressure checked again.  You may be asked to monitor your blood pressure at home for 1 week or longer.  If you are diagnosed with hypertension, you may have other blood or imaging tests to help your health care provider understand your overall risk for other conditions. How is this treated? This condition is treated by making healthy lifestyle  changes, such as eating healthy foods, exercising more, and reducing your alcohol intake. Your health care provider may prescribe medicine if lifestyle changes are not enough to get your blood pressure under control, and if:  Your systolic blood pressure is above 130.  Your diastolic blood pressure is above 80.  Your personal target blood pressure may vary depending on your  medical conditions, your age, and other factors. Follow these instructions at home: Eating and drinking  Eat a diet that is high in fiber and potassium, and low in sodium, added sugar, and fat. An example eating plan is called the DASH (Dietary Approaches to Stop Hypertension) diet. To eat this way: ? Eat plenty of fresh fruits and vegetables. Try to fill half of your plate at each meal with fruits and vegetables. ? Eat whole grains, such as whole wheat pasta, brown rice, or whole grain bread. Fill about one quarter of your plate with whole grains. ? Eat or drink low-fat dairy products, such as skim milk or low-fat yogurt. ? Avoid fatty cuts of meat, processed or cured meats, and poultry with skin. Fill about one quarter of your plate with lean proteins, such as fish, chicken without skin, beans, eggs, and tofu. ? Avoid premade and processed foods. These tend to be higher in sodium, added sugar, and fat.  Reduce your daily sodium intake. Most people with hypertension should eat less than 1,500 mg of sodium a day.  Limit alcohol intake to no more than 1 drink a day for nonpregnant women and 2 drinks a day for men. One drink equals 12 oz of beer, 5 oz of wine, or 1 oz of hard liquor. Lifestyle  Work with your health care provider to maintain a healthy body weight or to lose weight. Ask what an ideal weight is for you.  Get at least 30 minutes of exercise that causes your heart to beat faster (aerobic exercise) most days of the week. Activities may include walking, swimming, or biking.  Include exercise to strengthen your muscles (resistance exercise), such as pilates or lifting weights, as part of your weekly exercise routine. Try to do these types of exercises for 30 minutes at least 3 days a week.  Do not use any products that contain nicotine or tobacco, such as cigarettes and e-cigarettes. If you need help quitting, ask your health care provider.  Monitor your blood pressure at home as  told by your health care provider.  Keep all follow-up visits as told by your health care provider. This is important. Medicines  Take over-the-counter and prescription medicines only as told by your health care provider. Follow directions carefully. Blood pressure medicines must be taken as prescribed.  Do not skip doses of blood pressure medicine. Doing this puts you at risk for problems and can make the medicine less effective.  Ask your health care provider about side effects or reactions to medicines that you should watch for. Contact a health care provider if:  You think you are having a reaction to a medicine you are taking.  You have headaches that keep coming back (recurring).  You feel dizzy.  You have swelling in your ankles.  You have trouble with your vision. Get help right away if:  You develop a severe headache or confusion.  You have unusual weakness or numbness.  You feel faint.  You have severe pain in your chest or abdomen.  You vomit repeatedly.  You have trouble breathing. Summary  Hypertension is when the force  of blood pumping through your arteries is too strong. If this condition is not controlled, it may put you at risk for serious complications.  Your personal target blood pressure may vary depending on your medical conditions, your age, and other factors. For most people, a normal blood pressure is less than 120/80.  Hypertension is treated with lifestyle changes, medicines, or a combination of both. Lifestyle changes include weight loss, eating a healthy, low-sodium diet, exercising more, and limiting alcohol. This information is not intended to replace advice given to you by your health care provider. Make sure you discuss any questions you have with your health care provider. Document Released: 05/20/2005 Document Revised: 04/17/2016 Document Reviewed: 04/17/2016 Elsevier Interactive Patient Education  2018 [employer]    Exercising to  [place] Weight Exercising can help you to lose weight. In order to lose weight through exercise, you need to do vigorous-intensity exercise. You can tell that you are exercising with vigorous intensity if you are breathing very hard and fast and cannot hold a conversation while exercising. Moderate-intensity exercise helps to maintain your current weight. You can tell that you are exercising at a moderate level if you have a higher heart rate and faster breathing, but you are still able to hold a conversation. How often should I exercise? Choose an activity that you enjoy and set realistic goals. Your health care provider can help you to make an activity plan that works for you. Exercise regularly as directed by your health care provider. This may include:  Doing resistance training twice each week, such as: ? Push-ups. ? Sit-ups. ? Lifting weights. ? Using resistance bands.  Doing a given intensity of exercise for a given amount of time. Choose from these options: ? 150 minutes of moderate-intensity exercise every week. ? 75 minutes of vigorous-intensity exercise every week. ? A mix of moderate-intensity and vigorous-intensity exercise every week.  Children, pregnant women, people who are out of shape, people who are overweight, and older adults may need to consult a health care provider for individual recommendations. If you have any sort of medical condition, be sure to consult your health care provider before starting a new exercise program. What are some activities that can help me to lose weight?  Walking at a rate of at least 4.5 miles an hour.  Jogging or running at a rate of 5 miles per hour.  Biking at a rate of at least 10 miles per hour.  Lap swimming.  Roller-skating or in-line skating.  Cross-country skiing.  Vigorous competitive sports, such as football, basketball, and soccer.  Jumping rope.  Aerobic dancing. How can I be more active in my day-to-day  activities?  Use the stairs instead of the elevator.  Take a walk during your lunch break.  If you drive, park your car farther away from work or school.  If you take public transportation, get off one stop early and walk the rest of the way.  Make all of your phone calls while standing up and walking around.  Get up, stretch, and walk around every 30 minutes throughout the day. What guidelines should I follow while exercising?  Do not exercise so much that you hurt yourself, feel dizzy, or get very short of breath.  Consult your health care provider prior to starting a new exercise program.  Wear comfortable clothes and shoes with good support.  Drink plenty of water while you exercise to prevent dehydration or heat stroke. Body water is lost during exercise and  must be replaced.  Work out until you breathe faster and your heart beats faster. This information is not intended to replace advice given to you by your health care provider. Make sure you discuss any questions you have with your health care provider. Document Released: 06/22/2010 Document Revised: 10/26/2015 Document Reviewed: 10/21/2013 Elsevier Interactive Patient Education  [employer].
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
## 2018-08-13 ENCOUNTER — Telehealth: Payer: Self-pay | Admitting: Internal Medicine

## 2018-08-13 NOTE — Telephone Encounter (Unsigned)
Copied from CRM [id number]. Topic: Quick Communication - See Telephone Encounter >> Aug 13, 2018 10:16 AM Debroah Loop wrote: CRM for notification. See Telephone encounter for: 08/13/18. Patient was given 90 day supply of Lisinopril but lost the 3rd bottle. Wants to know if Dr. Alexia Freestone will fill another for him. Has a week left of the second bottle.

## 2018-08-16 NOTE — Telephone Encounter (Signed)
Call his pharmacy to see if they will release medication early he has refills and 1 year supply of lisinopril   Advise the pharmacy he lost Rx and this is what he needs to do in the future (call his pharmacy)  though he may need to pay for this if insurance will not cover the cost due to him losing it   Call pharmacy and patient to inform what pharmacy said   thanks TMS

## 2018-08-17 NOTE — Telephone Encounter (Signed)
Called and lmtcb on machine explaining that his lisinopril wil be filled and his out pocket cost is $10. pec ok to advise.  [place],cma

## 2018-10-13 ENCOUNTER — Telehealth: Payer: Self-pay | Admitting: *Deleted

## 2018-10-13 NOTE — Telephone Encounter (Signed)
Copied from CRM [id number]. Topic: Quick Communication - Appointment Cancellation >> Oct 13, 2018 11:20 AM Dalphine Handing A wrote: Patient called to cancel appointment scheduled for 10/16/2018. Patient would like to schedule a lab appointment as well. Patient would like a callback.

## 2018-10-13 NOTE — Telephone Encounter (Signed)
Pt wants to cancel appt 10/16/2018 and schedule lab visit  Please reschedule f/u after labs and schedule lab visit lab orders in expire 04/2019  Does he have insurance?   Make sure he wears a mask over nose and mouth   Thanks TMS

## 2018-10-14 NOTE — Telephone Encounter (Signed)
Appointments have been scheduled. He does have insurance. His card is on file

## 2018-10-16 ENCOUNTER — Ambulatory Visit: Payer: BLUE CROSS/BLUE SHIELD | Admitting: Internal Medicine

## 2018-11-13 ENCOUNTER — Other Ambulatory Visit (INDEPENDENT_AMBULATORY_CARE_PROVIDER_SITE_OTHER): Payer: BC Managed Care – PPO

## 2018-11-13 ENCOUNTER — Other Ambulatory Visit: Payer: Self-pay

## 2018-11-13 ENCOUNTER — Other Ambulatory Visit: Payer: Self-pay | Admitting: Internal Medicine

## 2018-11-13 DIAGNOSIS — R7303 Prediabetes: Secondary | ICD-10-CM | POA: Diagnosis not present

## 2018-11-13 DIAGNOSIS — Z1389 Encounter for screening for other disorder: Secondary | ICD-10-CM | POA: Diagnosis not present

## 2018-11-13 DIAGNOSIS — Z125 Encounter for screening for malignant neoplasm of prostate: Secondary | ICD-10-CM | POA: Diagnosis not present

## 2018-11-13 DIAGNOSIS — Z1159 Encounter for screening for other viral diseases: Secondary | ICD-10-CM

## 2018-11-13 DIAGNOSIS — Z1329 Encounter for screening for other suspected endocrine disorder: Secondary | ICD-10-CM | POA: Diagnosis not present

## 2018-11-13 DIAGNOSIS — I10 Essential (primary) hypertension: Secondary | ICD-10-CM

## 2018-11-13 DIAGNOSIS — E559 Vitamin D deficiency, unspecified: Secondary | ICD-10-CM

## 2018-11-13 DIAGNOSIS — E785 Hyperlipidemia, unspecified: Secondary | ICD-10-CM

## 2018-11-13 DIAGNOSIS — Z13818 Encounter for screening for other digestive system disorders: Secondary | ICD-10-CM | POA: Diagnosis not present

## 2018-11-13 LAB — VITAMIN D 25 HYDROXY (VIT D DEFICIENCY, FRACTURES): VITD: 18.22 ng/mL — ABNORMAL LOW (ref 30.00–100.00)

## 2018-11-13 LAB — LIPID PANEL
Cholesterol: 168 mg/dL (ref 0–200)
HDL: 36.1 mg/dL — ABNORMAL LOW (ref >39.00)
LDL Cholesterol: 110 mg/dL — ABNORMAL HIGH (ref 0–99)
NonHDL: 131.78
Total CHOL/HDL Ratio: 5
Triglycerides: 108 mg/dL (ref 0.0–149.0)
VLDL: 21.6 mg/dL (ref 0.0–40.0)

## 2018-11-13 LAB — CBC WITH DIFFERENTIAL/PLATELET
Basophils Absolute: 0.1 10*3/uL (ref 0.0–0.1)
Basophils Relative: 1.6 % (ref 0.0–3.0)
Eosinophils Absolute: 0.3 10*3/uL (ref 0.0–0.7)
Eosinophils Relative: 3.5 % (ref 0.0–5.0)
HCT: 48.4 % (ref 39.0–52.0)
Hemoglobin: 16.1 g/dL (ref 13.0–17.0)
Lymphocytes Relative: 25.4 % (ref 12.0–46.0)
Lymphs Abs: 2.1 10*3/uL (ref 0.7–4.0)
MCHC: 33.2 g/dL (ref 30.0–36.0)
MCV: 93.8 fl (ref 78.0–100.0)
Monocytes Absolute: 0.6 10*3/uL (ref 0.1–1.0)
Monocytes Relative: 7 % (ref 3.0–12.0)
Neutro Abs: 5.1 10*3/uL (ref 1.4–7.7)
Neutrophils Relative %: 62.5 % (ref 43.0–77.0)
Platelets: 283 10*3/uL (ref 150.0–400.0)
RBC: 5.17 Mil/uL (ref 4.22–5.81)
RDW: 13.5 % (ref 11.5–15.5)
WBC: 8.2 10*3/uL (ref 4.0–10.5)

## 2018-11-13 LAB — TSH: TSH: 1.97 u[IU]/mL (ref 0.35–4.50)

## 2018-11-13 LAB — COMPREHENSIVE METABOLIC PANEL
ALT: 44 U/L (ref 0–53)
AST: 25 U/L (ref 0–37)
Albumin: 4.2 g/dL (ref 3.5–5.2)
Alkaline Phosphatase: 36 U/L — ABNORMAL LOW (ref 39–117)
BUN: 23 mg/dL (ref 6–23)
CO2: 24 mEq/L (ref 19–32)
Calcium: 9.3 mg/dL (ref 8.4–10.5)
Chloride: 105 mEq/L (ref 96–112)
Creatinine, Ser: 0.98 mg/dL (ref 0.40–1.50)
GFR: 78.54 mL/min (ref >60.00)
Glucose, Bld: 110 mg/dL — ABNORMAL HIGH (ref 70–99)
Potassium: 4.7 mEq/L (ref 3.5–5.1)
Sodium: 137 mEq/L (ref 135–145)
Total Bilirubin: 0.5 mg/dL (ref 0.2–1.2)
Total Protein: 6.3 g/dL (ref 6.0–8.3)

## 2018-11-13 LAB — HEMOGLOBIN A1C: Hgb A1c MFr Bld: 6.2 % (ref 4.6–6.5)

## 2018-11-13 LAB — PSA: PSA: 0.92 ng/mL (ref 0.10–4.00)

## 2018-11-13 LAB — T4, FREE: Free T4: 0.72 ng/dL (ref 0.60–1.60)

## 2018-11-13 MED ORDER — CHOLECALCIFEROL 1.25 MG (50000 UT) PO CAPS: 50000.0000 [IU] | ORAL_CAPSULE | ORAL | 1 refills | Status: DC

## 2018-11-14 LAB — URINALYSIS, ROUTINE W REFLEX MICROSCOPIC
Bilirubin Urine: NEGATIVE
Glucose, UA: NEGATIVE
Hgb urine dipstick: NEGATIVE
Ketones, ur: NEGATIVE
Leukocytes,Ua: NEGATIVE
Nitrite: NEGATIVE
Protein, ur: NEGATIVE
Specific Gravity, Urine: 1.019 (ref 1.001–1.03)
pH: <=5 (ref 5.0–8.0)

## 2018-11-16 LAB — HEPATITIS B SURFACE ANTIBODY, QUANTITATIVE: Hepatitis B-Post: <5 m[IU]/mL — ABNORMAL LOW (ref >=10)

## 2018-11-16 LAB — HEPATITIS C ANTIBODY
Hepatitis C Ab: NONREACTIVE
SIGNAL TO CUT-OFF: 0.04 (ref <1.00)

## 2018-11-16 LAB — HEPATITIS B SURFACE ANTIGEN: Hepatitis B Surface Ag: NONREACTIVE

## 2018-11-26 ENCOUNTER — Ambulatory Visit (INDEPENDENT_AMBULATORY_CARE_PROVIDER_SITE_OTHER): Payer: BC Managed Care – PPO | Admitting: Internal Medicine

## 2018-11-26 ENCOUNTER — Other Ambulatory Visit: Payer: Self-pay

## 2018-11-26 ENCOUNTER — Encounter: Payer: Self-pay | Admitting: Internal Medicine

## 2018-11-26 DIAGNOSIS — R7303 Prediabetes: Secondary | ICD-10-CM | POA: Diagnosis not present

## 2018-11-26 DIAGNOSIS — M25562 Pain in left knee: Secondary | ICD-10-CM

## 2018-11-26 DIAGNOSIS — E559 Vitamin D deficiency, unspecified: Secondary | ICD-10-CM

## 2018-11-26 DIAGNOSIS — M79672 Pain in left foot: Secondary | ICD-10-CM | POA: Diagnosis not present

## 2018-11-26 DIAGNOSIS — I10 Essential (primary) hypertension: Secondary | ICD-10-CM

## 2018-11-26 DIAGNOSIS — Z9989 Dependence on other enabling machines and devices: Secondary | ICD-10-CM

## 2018-11-26 DIAGNOSIS — G4733 Obstructive sleep apnea (adult) (pediatric): Secondary | ICD-10-CM

## 2018-11-26 DIAGNOSIS — G8929 Other chronic pain: Secondary | ICD-10-CM

## 2018-11-26 DIAGNOSIS — J309 Allergic rhinitis, unspecified: Secondary | ICD-10-CM

## 2018-11-26 DIAGNOSIS — E785 Hyperlipidemia, unspecified: Secondary | ICD-10-CM

## 2018-11-26 MED ORDER — AZELASTINE HCL 0.15 % NA SOLN: 1.0000 | Freq: Every day | NASAL | 11 refills | Status: AC | PRN

## 2018-11-26 MED ORDER — MONTELUKAST SODIUM 10 MG PO TABS: 10.0000 mg | ORAL_TABLET | Freq: Every day | ORAL | 3 refills | Status: DC

## 2018-11-26 MED ORDER — LORATADINE 10 MG PO TABS: 10.0000 mg | ORAL_TABLET | Freq: Every day | ORAL | 3 refills | Status: DC | PRN

## 2018-11-26 NOTE — Patient Instructions (Signed)
Foot Pain Many things can cause foot pain. Some common causes are:  An injury.  A sprain.  Arthritis.  Blisters.  Bunions. Follow these instructions at home: Pay attention to any changes in your symptoms. Take these actions to help with your discomfort:  If directed, put ice on the affected area: ? Put ice in a plastic bag. ? Place a towel between your skin and the bag. ? Leave the ice on for 15-20 minutes, 3?4 times a day for 2 days.  Take over-the-counter and prescription medicines only as told by your health care provider.  Wear comfortable, supportive shoes that fit you well. Do not wear high heels.  Do not stand or walk for long periods of time.  Do not lift a lot of weight. This can put added pressure on your feet.  Do stretches to relieve foot pain and stiffness as told by your health care provider.  Rub your foot gently.  Keep your feet clean and dry. Contact a health care provider if:  Your pain does not get better after a few days of self-care.  Your pain gets worse.  You cannot stand on your foot. Get help right away if:  Your foot is numb or tingling.  Your foot or toes are swollen.  Your foot or toes turn white or blue.  You have warmth and redness along your foot. This information is not intended to replace advice given to you by your health care provider. Make sure you discuss any questions you have with your health care provider. Document Released: 06/16/2015 Document Revised: 10/26/2015 Document Reviewed: 06/15/2014 Elsevier Interactive Patient Education  2019 Elsevier Inc.  Cholesterol Cholesterol is a white, waxy, fat-like substance that is needed by the human body in small amounts. The liver makes all the cholesterol we need. Cholesterol is carried from the liver by the blood through the blood vessels. Deposits of cholesterol (plaques) may build up on blood vessel (artery) walls. Plaques make the arteries narrower and stiffer. Cholesterol  plaques increase the risk for heart attack and stroke. You cannot feel your cholesterol level even if it is very high. The only way to know that it is high is to have a blood test. Once you know your cholesterol levels, you should keep a record of the test results. Work with your health care provider to keep your levels in the desired range. What do the results mean?  Total cholesterol is a rough measure of all the cholesterol in your blood.  LDL (low-density lipoprotein) is the "bad" cholesterol. This is the type that causes plaque to build up on the artery walls. You want this level to be low.  HDL (high-density lipoprotein) is the "good" cholesterol because it cleans the arteries and carries the LDL away. You want this level to be high.  Triglycerides are fat that the body can either burn for energy or store. High levels are closely linked to heart disease. What are the desired levels of cholesterol?  Total cholesterol below 200.  LDL below 100 for people who are at risk, below 70 for people at very high risk.  HDL above 40 is good. A level of 60 or higher is considered to be protective against heart disease.  Triglycerides below 150. How can I lower my cholesterol? Diet Follow your diet program as told by your health care provider.  Choose fish or white meat chicken and [place], roasted or baked. Limit fatty cuts of red meat, fried foods, and processed meats, such  as sausage and lunch meats.  Eat lots of fresh fruits and vegetables.  Choose whole grains, beans, pasta, potatoes, and cereals.  Choose olive oil, corn oil, or canola oil, and use only small amounts.  Avoid butter, mayonnaise, shortening, or palm kernel oils.  Avoid foods with trans fats.  Drink skim or nonfat milk and eat low-fat or nonfat yogurt and cheeses. Avoid whole milk, cream, ice cream, egg yolks, and full-fat cheeses.  Healthier desserts include angel food cake, ginger snaps, animal crackers, hard candy,  popsicles, and low-fat or nonfat frozen yogurt. Avoid pastries, cakes, pies, and cookies.  Exercise  Follow your exercise program as told by your health care provider. A regular program: ? Helps to decrease LDL and raise HDL. ? Helps with weight control.  Do things that increase your activity level, such as gardening, walking, and taking the stairs.  Ask your health care provider about ways that you can be more active in your daily life. Medicine  Take over-the-counter and prescription medicines only as told by your health care provider. ? Medicine may be prescribed by your health care provider to help lower cholesterol and decrease the risk for heart disease. This is usually done if diet and exercise have failed to bring down cholesterol levels. ? If you have several risk factors, you may need medicine even if your levels are normal. This information is not intended to replace advice given to you by your health care provider. Make sure you discuss any questions you have with your health care provider. Document Released: 02/12/2001 Document Revised: 12/16/2015 Document Reviewed: 11/18/2015 Elsevier Interactive Patient Education  2019 Elsevier Inc.  Vitamin D Deficiency Vitamin D deficiency is when your body does not have enough vitamin D. Vitamin D is important to your body for many reasons:  It helps the body to absorb two important minerals, called calcium and phosphorus.  It plays a role in bone health.  It may help to prevent some diseases, such as diabetes and multiple sclerosis.  It plays a role in muscle function, including heart function. You can get vitamin D by:  Eating foods that naturally contain vitamin D.  Eating or drinking milk or other dairy products that have vitamin D added to them.  Taking a vitamin D supplement or a multivitamin supplement that contains vitamin D.  Being in the sun. Your body naturally makes vitamin D when your skin is exposed to sunlight.  Your body changes the sunlight into a form of the vitamin that the body can use. If vitamin D deficiency is severe, it can cause a condition in which your bones become soft. In adults, this condition is called osteomalacia. In children, this condition is called rickets. What are the causes? Vitamin D deficiency may be caused by:  Not eating enough foods that contain vitamin D.  Not getting enough sun exposure.  Having certain digestive system diseases that make it difficult for your body to absorb vitamin D. These diseases include Crohn disease, chronic pancreatitis, and cystic fibrosis.  Having a surgery in which a part of the stomach or a part of the small intestine is removed.  Being obese.  Having chronic kidney disease or liver disease. What increases the risk? This condition is more likely to develop in:  Older people.  People who do not spend much time outdoors.  People who live in a long-term care facility.  People who have had broken bones.  People with weak or thin bones (osteoporosis).  People who  have a disease or condition that changes how the body absorbs vitamin D.  People who have dark skin.  People who take certain medicines, such as steroid medicines or certain seizure medicines.  People who are overweight or obese. What are the signs or symptoms? In mild cases of vitamin D deficiency, there may not be any symptoms. If the condition is severe, symptoms may include:  Bone pain.  Muscle pain.  Falling often.  Broken bones caused by a minor injury. How is this diagnosed? This condition is usually diagnosed with a blood test. How is this treated? Treatment for this condition may depend on what caused the condition. Treatment options include:  Taking vitamin D supplements.  Taking a calcium supplement. Your health care provider will suggest what dose is best for you. Follow these instructions at home:  Take medicines and supplements only as told by  your health care provider.  Eat foods that contain vitamin D. Choices include: ? Fortified dairy products, cereals, or juices. Fortified means that vitamin D has been added to the food. Check the label on the package to be sure. ? Fatty fish, such as salmon or trout. ? Eggs. ? Oysters.  Do not use a tanning bed.  Maintain a healthy weight. Lose weight, if needed.  Keep all follow-up visits as told by your health care provider. This is important. Contact a health care provider if:  Your symptoms do not go away.  You feel like throwing up (nausea) or you throw up (vomit).  You have fewer bowel movements than usual or it is difficult for you to have a bowel movement (constipation). This information is not intended to replace advice given to you by your health care provider. Make sure you discuss any questions you have with your health care provider. Document Released: 08/12/2011 Document Revised: 11/01/2015 Document Reviewed: 10/05/2014 Elsevier Interactive Patient Education  2019 [employer].

## 2018-11-26 NOTE — Progress Notes (Signed)
Telephone Note  I connected with John CeaJames Orozco  on 11/26/18 at  4:10 PM EDT telephone and verified that I am speaking with the correct person using two identifiers.  Location patient: home Location provider:work Persons participating in the virtual visit: patient, provider  I discussed the limitations of evaluation and management by telemedicine and the availability of in person appointments. The patient expressed understanding and agreed to proceed.   HPI: 1. HTN controlled per pt BP 120s-upper 120s/70s-75 Dot physical had 08/2018 and BP was good  2. HLD 11/13/18 he does report eating more cheese but overall trying to eat healthy  3. Vitamin D def D3 on 50K weekly  4. C/o chronic left knee pain though better after Tylenol prn and also using topical CBD which helped with knee pain but did not help with left foot pain. Left foot is aching x 1.5 months on the outer edge no swelling, discoloration, trauma or bone protrusion. Pain is 9/10 at times and at times 5/10 but tylenol helps. Wt bearing makes worse  5. Allergic rhinitis on claritin which helps more than zyrtec and asteopro nasal helps he is currently using Afrin  6. Obesity and prediabetes A1C 6.2  7. OSA on cpap had cpap checked in 2019. He has been on cpap machine x 7-8 hrs    ROS: See pertinent positives and negatives per HPI.  Past Medical History:  Diagnosis Date  . Arthritis   . Essential hypertension   . Hyperlipidemia   . Left knee pain   . Lymphedema   . Morbid obesity with BMI of 50.0-59.9, adult (HCC)   . OSA on CPAP     Past Surgical History:  Procedure Laterality Date  . knee surgery     left 58 y.o   . UMBILICAL HERNIA REPAIR      Family History  Problem Relation Age of Onset  . Hypertension Mother   . Heart disease Mother     SOCIAL HX:  Married  Kids  Dump Truck driver -[employer]   Current Outpatient Medications:  .  aspirin 81 MG chewable tablet, Chew 81 mg by mouth daily., Disp: , Rfl:  .   Azelastine HCl 0.15 % SOLN, Place 1-2 sprays into the nose daily as needed. If 2 sprays only use 1x per day., Disp: 30 mL, Rfl: 11 .  Cholecalciferol 1.25 MG (50000 UT) capsule, Take 1 capsule (50,000 Units total) by mouth once a week., Disp: 13 capsule, Rfl: 1 .  lisinopril (PRINIVIL,ZESTRIL) 30 MG tablet, Take 1 tablet (30 mg total) by mouth daily., Disp: 90 tablet, Rfl: 3 .  loratadine (CLARITIN) 10 MG tablet, Take 1 tablet (10 mg total) by mouth daily as needed for allergies., Disp: 90 tablet, Rfl: 3 .  montelukast (SINGULAIR) 10 MG tablet, Take 1 tablet (10 mg total) by mouth at bedtime., Disp: 90 tablet, Rfl: 3  EXAM:  VITALS per patient if applicable:  GENERAL: alert, oriented, appears well and in no acute distress  PSYCH/NEURO: pleasant and cooperative, no obvious depression or anxiety, speech and thought processing grossly intact  ASSESSMENT AND PLAN:  Discussed the following assessment and plan:  Essential hypertension - Plan: lis 30 mg qd   Prediabetes - Plan: A1C 6.2 rec healthy diet and exercise   Vitamin D deficiency - Plan: D3 50K IU weekly x 6 months then 2000 to 5000 IU daily   Left foot pain - Plan: DG Foot Complete Left, Chronic pain of left knee - Plan: DG Knee Complete  4 Views Left  Allergic rhinitis, unspecified seasonality, unspecified trigger - Plan: loratadine (CLARITIN) 10 MG tablet, montelukast (SINGULAIR) 10 MG tablet, Azelastine HCl 0.15 % SOLN rec stop Afrin   OSA on CPAP - Plan: cont cpap benefiting   Hyperlipidemia, unspecified hyperlipidemia type - Plan: rec healthy diet and exercise mailed cholesterol info   HM Flu shot utd  Tdap had <10 years ago ? When  Consider hep B vx in future Hep c negative  Declines HIV testing  Disc exercise to lose wt and healthy diet choices  Never smoker  Colonoscopy had in [place] no FH colon     I discussed the assessment and treatment plan with the patient. The patient was provided an opportunity  to ask questions and all were answered. The patient agreed with the plan and demonstrated an understanding of the instructions.   The patient was advised to call back or seek an in-person evaluation if the symptoms worsen or if the condition fails to improve as anticipated.  Time spent 25 minutes  Delorise Jackson, MD

## 2018-11-27 ENCOUNTER — Telehealth: Payer: Self-pay | Admitting: Internal Medicine

## 2018-11-27 NOTE — Telephone Encounter (Signed)
I called pt and left a vm to call ofc to schedule 6 month follow up. °

## 2018-12-01 ENCOUNTER — Telehealth: Payer: Self-pay | Admitting: Internal Medicine

## 2018-12-01 NOTE — Telephone Encounter (Signed)
Does he want to try Xyzal 5 mg for allergies?  This is over the counter as well  rec Nasal saline, Flonase and Azestaline/Asteopro nose spray   TMS

## 2018-12-01 NOTE — Telephone Encounter (Signed)
Pt states that the medication that was prescribed (loratadine (CLARITIN) 10 MG tablet ormontelukast (SINGULAIR) 10 MG tablet ) does not seem to be working. Pt will call back to confirm medication he's on the road so not sure.

## 2018-12-02 NOTE — Telephone Encounter (Signed)
Patient would like to have it sent in due to price. Please advise.

## 2018-12-03 ENCOUNTER — Other Ambulatory Visit: Payer: BC Managed Care – PPO

## 2018-12-09 ENCOUNTER — Ambulatory Visit (INDEPENDENT_AMBULATORY_CARE_PROVIDER_SITE_OTHER): Payer: BC Managed Care – PPO

## 2018-12-09 ENCOUNTER — Other Ambulatory Visit: Payer: BC Managed Care – PPO

## 2018-12-09 ENCOUNTER — Other Ambulatory Visit: Payer: Self-pay

## 2018-12-09 DIAGNOSIS — M25562 Pain in left knee: Secondary | ICD-10-CM

## 2018-12-09 DIAGNOSIS — G8929 Other chronic pain: Secondary | ICD-10-CM

## 2018-12-09 DIAGNOSIS — M79672 Pain in left foot: Secondary | ICD-10-CM | POA: Diagnosis not present

## 2018-12-10 ENCOUNTER — Other Ambulatory Visit: Payer: Self-pay | Admitting: Internal Medicine

## 2018-12-10 ENCOUNTER — Other Ambulatory Visit: Payer: BC Managed Care – PPO

## 2018-12-10 DIAGNOSIS — M79672 Pain in left foot: Secondary | ICD-10-CM

## 2018-12-10 DIAGNOSIS — G8929 Other chronic pain: Secondary | ICD-10-CM

## 2019-04-19 ENCOUNTER — Other Ambulatory Visit: Payer: Self-pay

## 2019-04-23 ENCOUNTER — Other Ambulatory Visit: Payer: Self-pay | Admitting: Internal Medicine

## 2019-04-23 DIAGNOSIS — E559 Vitamin D deficiency, unspecified: Secondary | ICD-10-CM

## 2019-04-23 NOTE — Telephone Encounter (Signed)
Requested medication (s) are due for refill today: yes  Requested medication (s) are on the active medication list: yes  Last refill:  11/13/2018  Future visit scheduled: yes  Notes to clinic:  Review for refill   Requested Prescriptions  Pending Prescriptions Disp Refills   Cholecalciferol 1.25 MG (50000 UT) capsule 13 capsule 1    Sig: Take 1 capsule (50,000 Units total) by mouth once a week.     There is no refill protocol information for this order

## 2019-04-23 NOTE — Telephone Encounter (Signed)
Medication Refill - Medication:   Cholecalciferol 1.25 MG (50000 UT) capsule  4   Has the patient contacted their pharmacy? Yes.  4 (Agent: If no, request that the patient contact the pharmacy for the refill.) (Agent: If yes, when and what did the pharmacy advise?)  Preferred Pharmacy (with phone number or street name):  [facility] [address], [place] - [place] [phone] (Phone) [phone] (Fax)     Agent: Please be advised that RX refills may take up to 3 business days. We ask that you follow-up with your pharmacy.

## 2019-04-28 NOTE — Telephone Encounter (Signed)
Call and d/c D3 50K IU weekly  He can take D3 4000 IU daily otc   DISH

## 2019-05-04 NOTE — Telephone Encounter (Signed)
Pt called in to follow up on message. Pt would like a call back to discuss further. Advised pt per PCP message below.

## 2019-06-01 ENCOUNTER — Telehealth: Payer: Self-pay | Admitting: Internal Medicine

## 2019-06-01 NOTE — Telephone Encounter (Signed)
I called pt twice and was unable to leave vm 

## 2019-06-02 ENCOUNTER — Ambulatory Visit: Payer: BC Managed Care – PPO | Admitting: Internal Medicine

## 2019-06-09 ENCOUNTER — Other Ambulatory Visit: Payer: Self-pay | Admitting: Internal Medicine

## 2019-06-09 DIAGNOSIS — I10 Essential (primary) hypertension: Secondary | ICD-10-CM

## 2019-06-09 MED ORDER — LISINOPRIL 30 MG PO TABS: 30.0000 mg | ORAL_TABLET | Freq: Every day | ORAL | 1 refills | Status: DC

## 2019-08-11 DIAGNOSIS — Z23 Encounter for immunization: Secondary | ICD-10-CM | POA: Diagnosis not present

## 2019-08-11 DIAGNOSIS — G5602 Carpal tunnel syndrome, left upper limb: Secondary | ICD-10-CM | POA: Diagnosis not present

## 2019-08-11 DIAGNOSIS — E119 Type 2 diabetes mellitus without complications: Secondary | ICD-10-CM | POA: Diagnosis not present

## 2019-08-11 DIAGNOSIS — N3281 Overactive bladder: Secondary | ICD-10-CM | POA: Diagnosis not present

## 2019-08-11 DIAGNOSIS — M79605 Pain in left leg: Secondary | ICD-10-CM | POA: Diagnosis not present

## 2019-09-11 ENCOUNTER — Ambulatory Visit: Payer: BC Managed Care – PPO | Attending: Internal Medicine

## 2019-09-11 DIAGNOSIS — Z23 Encounter for immunization: Secondary | ICD-10-CM

## 2019-09-11 NOTE — Progress Notes (Signed)
   Covid-19 Vaccination Clinic  Name:  Barnes Florek    MRN: [id number] DOB: 18-Apr-1961  09/11/2019  Mr. Krichbaum was observed post Covid-19 immunization for 15 minutes without incident. He was provided with Vaccine Information Sheet and instruction to access the V-Safe system.   Mr. Mesa was instructed to call 911 with any severe reactions post vaccine: Marland Kitchen Difficulty breathing  . Swelling of face and throat  . A fast heartbeat  . A bad rash all over body  . Dizziness and weakness   Immunizations Administered    Name Date Dose VIS Date Route   Pfizer COVID-19 Vaccine 09/11/2019  8:31 AM 0.3 mL 05/14/2019 Intramuscular   Manufacturer: [employer], [employer]   Lot: [id number]   NDC: [address]-0

## 2019-10-09 ENCOUNTER — Ambulatory Visit: Payer: BC Managed Care – PPO | Attending: Internal Medicine

## 2019-10-09 DIAGNOSIS — Z23 Encounter for immunization: Secondary | ICD-10-CM

## 2019-10-09 NOTE — Progress Notes (Signed)
   Covid-19 Vaccination Clinic  Name:  John Orozco    MRN: [id number] DOB: April 10, 1961  10/09/2019  Mr. [name] was observed post Covid-19 immunization for 15 minutes without incident. He was provided with Vaccine Information Sheet and instruction to access the V-Safe system.   Mr. [name] was instructed to call 911 with any severe reactions post vaccine: Marland Kitchen Difficulty breathing  . Swelling of face and throat  . A fast heartbeat  . A bad rash all over body  . Dizziness and weakness   Immunizations Administered    Name Date Dose VIS Date Route   Pfizer COVID-19 Vaccine 10/09/2019  9:03 AM 0.3 mL 07/28/2018 Intramuscular   Manufacturer: [employer], [employer]   Lot: [id number]   NDC: [address]-2

## 2019-11-25 ENCOUNTER — Telehealth: Payer: Self-pay | Admitting: Internal Medicine

## 2019-11-25 DIAGNOSIS — I10 Essential (primary) hypertension: Secondary | ICD-10-CM

## 2019-11-25 DIAGNOSIS — Z20822 Contact with and (suspected) exposure to covid-19: Secondary | ICD-10-CM | POA: Diagnosis not present

## 2019-11-25 MED ORDER — LISINOPRIL 30 MG PO TABS: 30.0000 mg | ORAL_TABLET | Freq: Every day | ORAL | 0 refills | Status: DC

## 2019-11-25 NOTE — Telephone Encounter (Signed)
Pt needs a refill on lisinopril (ZESTRIL) 30 MG tablet sent to [facility]

## 2019-11-25 NOTE — Telephone Encounter (Signed)
Patient needs an appointment to receive further refills. Only 30 days given.

## 2019-12-20 NOTE — Telephone Encounter (Signed)
Pt called and asked why we only sent over 30 day supply. I explained to pt that he would need to make an appt because it had been a while since he had been seen. He proceeded to call me stupid and that he "couldn't believe the stupidity of the office as a whole." and continued to mock me. I told him that I was going to put him on hold so he could cool down and he hung up the phone.

## 2019-12-23 ENCOUNTER — Telehealth (INDEPENDENT_AMBULATORY_CARE_PROVIDER_SITE_OTHER): Payer: BC Managed Care – PPO | Admitting: Internal Medicine

## 2019-12-23 ENCOUNTER — Encounter: Payer: Self-pay | Admitting: Internal Medicine

## 2019-12-23 VITALS — Ht 70.0 in | Wt 350.0 lb

## 2019-12-23 DIAGNOSIS — Z125 Encounter for screening for malignant neoplasm of prostate: Secondary | ICD-10-CM

## 2019-12-23 DIAGNOSIS — Z6841 Body Mass Index (BMI) 40.0 and over, adult: Secondary | ICD-10-CM

## 2019-12-23 DIAGNOSIS — E559 Vitamin D deficiency, unspecified: Secondary | ICD-10-CM

## 2019-12-23 DIAGNOSIS — Z1389 Encounter for screening for other disorder: Secondary | ICD-10-CM

## 2019-12-23 DIAGNOSIS — Z1329 Encounter for screening for other suspected endocrine disorder: Secondary | ICD-10-CM

## 2019-12-23 DIAGNOSIS — E785 Hyperlipidemia, unspecified: Secondary | ICD-10-CM | POA: Diagnosis not present

## 2019-12-23 DIAGNOSIS — I10 Essential (primary) hypertension: Secondary | ICD-10-CM

## 2019-12-23 DIAGNOSIS — J309 Allergic rhinitis, unspecified: Secondary | ICD-10-CM

## 2019-12-23 DIAGNOSIS — R7303 Prediabetes: Secondary | ICD-10-CM | POA: Diagnosis not present

## 2019-12-23 MED ORDER — LISINOPRIL 30 MG PO TABS: 30.0000 mg | ORAL_TABLET | Freq: Every day | ORAL | 1 refills | Status: DC

## 2019-12-23 MED ORDER — LORATADINE 10 MG PO TABS: 10.0000 mg | ORAL_TABLET | Freq: Every day | ORAL | 3 refills | Status: DC | PRN

## 2019-12-23 NOTE — Progress Notes (Signed)
Telephone Note  I connected with John Orozco  on 12/23/19 at  4:20 PM EDT by telephone application and verified that I am speaking with the correct person using two identifiers.  Location patient: truck Location provider:work or home office Persons participating in the virtual visit: patient, provider  I discussed the limitations of evaluation and management by telemedicine and the availability of in person appointments. The patient expressed understanding and agreed to proceed.   HPI:  F/u  1. 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 Arthritis to knees improved for now  3. Just returned from [place] and enjoyed the trip with hi swife  4. Diarrhea recently he and his wife but he thinks was due to food he ate and resolved after a couple of dash they had eaten fish   ROS: See pertinent positives and negatives per HPI.  Past Medical History:  Diagnosis Date  . Arthritis   . Essential hypertension   . Hyperlipidemia   . Left knee pain   . Lymphedema   . Morbid obesity with BMI of 50.0-59.9, adult (HCC)   . OSA on CPAP     Past Surgical History:  Procedure Laterality Date  . knee surgery     left 59 y.o   . UMBILICAL HERNIA REPAIR      Family History  Problem Relation Age of Onset  . Hypertension Mother   . Heart disease Mother     SOCIAL HX: married    Current Outpatient Medications:  .  aspirin 81 MG chewable tablet, Chew 81 mg by mouth daily., Disp: , Rfl:  .  Cholecalciferol 1.25 MG (50000 UT) capsule, Take 1 capsule (50,000 Units total) by mouth once a week., Disp: 13 capsule, Rfl: 1 .  lisinopril (ZESTRIL) 30 MG tablet, Take 1 tablet (30 mg total) by mouth daily. appt further refills seen today 12/23/19 needs to f/u in person in 3-6 months, Disp: 90 tablet, Rfl: 1 .  loratadine (CLARITIN) 10 MG tablet, Take 1 tablet (10 mg total) by mouth daily as needed for allergies., Disp: 90  tablet, Rfl: 3 .  Azelastine HCl 0.15 % SOLN, Place 1-2 sprays into the nose daily as needed. If 2 sprays only use 1x per day. (Patient not taking: Reported on 12/23/2019), Disp: 30 mL, Rfl: 11  EXAM:  VITALS per patient if applicable:  GENERAL: alert, oriented, appears well and in no acute distress  PSYCH/NEURO: pleasant and cooperative, no obvious depression or anxiety, speech and thought processing grossly intact  ASSESSMENT AND PLAN:  Discussed the following assessment and plan:  Essential hypertension - Plan: Comprehensive metabolic panel, Lipid panel, CBC w/Diff, lisinopril (ZESTRIL) 30 MG tablet  Prediabetes - Plan: Hemoglobin A1c  Hyperlipidemia, unspecified hyperlipidemia type  Vitamin D deficiency - Plan: Vitamin D (25 hydroxy)  Morbid obesity with BMI of 50.0-59.9, adult (HCC) rec healthy diet and exercise   Allergic rhinitis, unspecified seasonality, unspecified trigger - Plan: loratadine (CLARITIN) 10 MG tablet  HM-CPE at f/u Flu shot utd  Tdap had <10 years ago ? When  Consider hep B vx in future Hep c negative  Declines HIV testing  Disc exercise to lose wt and healthy diet choices  Never smoker  Colonoscopy had in [place] no FH colon     -we discussed possible serious and likely etiologies, options for evaluation and workup, limitations of telemedicine visit vs in person  visit, treatment, treatment risks and precautions. Pt prefers to treat via telemedicine empirically rather then risking or undertaking an in person visit at this moment. Patient agrees to seek prompt in person care if worsening, new symptoms arise, or if is not improving with treatment.   I discussed the assessment and treatment plan with the patient. The patient was provided an opportunity to ask questions and all were answered. The patient agreed with the plan and demonstrated an understanding of the instructions.   The patient was advised to call back or seek an in-person  evaluation if the symptoms worsen or if the condition fails to improve as anticipated.  Time spent 20 min Bevelyn Buckles, MD

## 2019-12-23 NOTE — Patient Instructions (Signed)
Prediabetes Eating Plan Prediabetes is a condition that causes blood sugar (glucose) levels to be higher than normal. This increases the risk for developing diabetes. In order to prevent diabetes from developing, your health care provider may recommend a diet and other lifestyle changes to help you:  Control your blood glucose levels.  Improve your cholesterol levels.  Manage your blood pressure. Your health care provider may recommend working with a diet and nutrition specialist (dietitian) to make a meal plan that is best for you. What are tips for following this plan? Lifestyle  Set weight loss goals with the help of your health care team. It is recommended that most people with prediabetes lose 7% of their current body weight.  Exercise for at least 30 minutes at least 5 days a week.  Attend a support group or seek ongoing support from a mental health counselor.  Take over-the-counter and prescription medicines only as told by your health care provider. Reading food labels  Read food labels to check the amount of fat, salt (sodium), and sugar in prepackaged foods. Avoid foods that have: ? Saturated fats. ? Trans fats. ? Added sugars.  Avoid foods that have more than 300 milligrams (mg) of sodium per serving. Limit your daily sodium intake to less than 2,300 mg each day. Shopping  Avoid buying pre-made and processed foods. Cooking  Cook with olive oil. Do not use butter, lard, or ghee.  Bake, broil, grill, or boil foods. Avoid frying. Meal planning   Work with your dietitian to develop an eating plan that is right for you. This may include: ? Tracking how many calories you take in. Use a food diary, notebook, or mobile application to track what you eat at each meal. ? Using the glycemic index (GI) to plan your meals. The index tells you how quickly a food will raise your blood glucose. Choose low-GI foods. These foods take a longer time to raise blood glucose.  Consider  following a Mediterranean diet. This diet includes: ? Several servings each day of fresh fruits and vegetables. ? Eating fish at least twice a week. ? Several servings each day of whole grains, beans, nuts, and seeds. ? Using olive oil instead of other fats. ? Moderate alcohol consumption. ? Eating small amounts of red meat and whole-fat dairy.  If you have high blood pressure, you may need to limit your sodium intake or follow a diet such as the DASH eating plan. DASH is an eating plan that aims to lower high blood pressure. What foods are recommended? The items listed below may not be a complete list. Talk with your dietitian about what dietary choices are best for you. Grains Whole grains, such as whole-wheat or whole-grain breads, crackers, cereals, and pasta. Unsweetened oatmeal. Bulgur. Barley. Quinoa. Brown rice. Corn or whole-wheat flour tortillas or taco shells. Vegetables Lettuce. Spinach. Peas. Beets. Cauliflower. Cabbage. Broccoli. Carrots. Tomatoes. Squash. Eggplant. Herbs. Peppers. Onions. Cucumbers. Brussels sprouts. Fruits Berries. Bananas. Apples. Oranges. Grapes. Papaya. Mango. Pomegranate. Kiwi. Grapefruit. Cherries. Meats and other protein foods Seafood. Poultry without skin. Lean cuts of pork and beef. Tofu. Eggs. Nuts. Beans. Dairy Low-fat or fat-free dairy products, such as yogurt, cottage cheese, and cheese. Beverages Water. Tea. Coffee. Sugar-free or diet soda. Seltzer water. Lowfat or no-fat milk. Milk alternatives, such as soy or almond milk. Fats and oils Olive oil. Canola oil. Sunflower oil. Grapeseed oil. Avocado. Walnuts. Sweets and desserts Sugar-free or low-fat pudding. Sugar-free or low-fat ice cream and other frozen treats.   Seasoning and other foods Herbs. Sodium-free spices. Mustard. Relish. Low-fat, low-sugar ketchup. Low-fat, low-sugar barbecue sauce. Low-fat or fat-free mayonnaise. What foods are not recommended? The items listed below may not be a  complete list. Talk with your dietitian about what dietary choices are best for you. Grains Refined white flour and flour products, such as bread, pasta, snack foods, and cereals. Vegetables Canned vegetables. Frozen vegetables with butter or cream sauce. Fruits Fruits canned with syrup. Meats and other protein foods Fatty cuts of meat. Poultry with skin. Breaded or fried meat. Processed meats. Dairy Full-fat yogurt, cheese, or milk. Beverages Sweetened drinks, such as sweet iced tea and soda. Fats and oils Butter. Lard. Ghee. Sweets and desserts Baked goods, such as cake, cupcakes, pastries, cookies, and cheesecake. Seasoning and other foods Spice mixes with added salt. Ketchup. Barbecue sauce. Mayonnaise. Summary  To prevent diabetes from developing, you may need to make diet and other lifestyle changes to help control blood sugar, improve cholesterol levels, and manage your blood pressure.  Set weight loss goals with the help of your health care team. It is recommended that most people with prediabetes lose 7 percent of their current body weight.  Consider following a Mediterranean diet that includes plenty of fresh fruits and vegetables, whole grains, beans, nuts, seeds, fish, lean meat, low-fat dairy, and healthy oils. This information is not intended to replace advice given to you by your health care provider. Make sure you discuss any questions you have with your health care provider. Document Revised: 09/11/2018 Document Reviewed: 07/24/2016 Elsevier Patient Education  2020 [employer] DASH Eating Plan DASH stands for "Dietary Approaches to Stop Hypertension." The DASH eating plan is a healthy eating plan that has been shown to reduce high blood pressure (hypertension). It may also reduce your risk for type 2 diabetes, heart disease, and stroke. The DASH eating plan may also help with weight loss. What are tips for following this plan?  General guidelines  Avoid eating  more than 2,300 mg (milligrams) of salt (sodium) a day. If you have hypertension, you may need to reduce your sodium intake to 1,500 mg a day.  Limit alcohol intake to no more than 1 drink a day for nonpregnant women and 2 drinks a day for men. One drink equals 12 oz of beer, 5 oz of wine, or 1 oz of hard liquor.  Work with your health care provider to maintain a healthy body weight or to lose weight. Ask what an ideal weight is for you.  Get at least 30 minutes of exercise that causes your heart to beat faster (aerobic exercise) most days of the week. Activities may include walking, swimming, or biking.  Work with your health care provider or diet and nutrition specialist (dietitian) to adjust your eating plan to your individual calorie needs. Reading food labels   Check food labels for the amount of sodium per serving. Choose foods with less than 5 percent of the Daily Value of sodium. Generally, foods with less than 300 mg of sodium per serving fit into this eating plan.  To find whole grains, look for the word "whole" as the first word in the ingredient list. Shopping  Buy products labeled as "low-sodium" or "no salt added."  Buy fresh foods. Avoid canned foods and premade or frozen meals. Cooking  Avoid adding salt when cooking. Use salt-free seasonings or herbs instead of table salt or sea salt. Check with your health care provider or pharmacist before using salt substitutes.    Do not fry foods. Cook foods using healthy methods such as baking, boiling, grilling, and broiling instead.  Cook with heart-healthy oils, such as olive, canola, soybean, or sunflower oil. Meal planning  Eat a balanced diet that includes: ? 5 or more servings of fruits and vegetables each day. At each meal, try to fill half of your plate with fruits and vegetables. ? Up to 6-8 servings of whole grains each day. ? Less than 6 oz of lean meat, poultry, or fish each day. A 3-oz serving of meat is about the  same size as a deck of cards. One egg equals 1 oz. ? 2 servings of low-fat dairy each day. ? A serving of nuts, seeds, or beans 5 times each week. ? Heart-healthy fats. Healthy fats called Omega-3 fatty acids are found in foods such as flaxseeds and coldwater fish, like sardines, salmon, and mackerel.  Limit how much you eat of the following: ? Canned or prepackaged foods. ? Food that is high in trans fat, such as fried foods. ? Food that is high in saturated fat, such as fatty meat. ? Sweets, desserts, sugary drinks, and other foods with added sugar. ? Full-fat dairy products.  Do not salt foods before eating.  Try to eat at least 2 vegetarian meals each week.  Eat more home-cooked food and less restaurant, buffet, and fast food.  When eating at a restaurant, ask that your food be prepared with less salt or no salt, if possible. What foods are recommended? The items listed may not be a complete list. Talk with your dietitian about what dietary choices are best for you. Grains Whole-grain or whole-wheat bread. Whole-grain or whole-wheat pasta. Brown rice. Oatmeal. Quinoa. Bulgur. Whole-grain and low-sodium cereals. Pita bread. Low-fat, low-sodium crackers. Whole-wheat flour tortillas. Vegetables Fresh or frozen vegetables (raw, steamed, roasted, or grilled). Low-sodium or reduced-sodium tomato and vegetable juice. Low-sodium or reduced-sodium tomato sauce and tomato paste. Low-sodium or reduced-sodium canned vegetables. Fruits All fresh, dried, or frozen fruit. Canned fruit in natural juice (without added sugar). Meat and other protein foods Skinless chicken or turkey. Ground chicken or turkey. Pork with fat trimmed off. Fish and seafood. Egg whites. Dried beans, peas, or lentils. Unsalted nuts, nut butters, and seeds. Unsalted canned beans. Lean cuts of beef with fat trimmed off. Low-sodium, lean deli meat. Dairy Low-fat (1%) or fat-free (skim) milk. Fat-free, low-fat, or reduced-fat  cheeses. Nonfat, low-sodium ricotta or cottage cheese. Low-fat or nonfat yogurt. Low-fat, low-sodium cheese. Fats and oils Soft margarine without trans fats. Vegetable oil. Low-fat, reduced-fat, or light mayonnaise and salad dressings (reduced-sodium). Canola, safflower, olive, soybean, and sunflower oils. Avocado. Seasoning and other foods Herbs. Spices. Seasoning mixes without salt. Unsalted popcorn and pretzels. Fat-free sweets. What foods are not recommended? The items listed may not be a complete list. Talk with your dietitian about what dietary choices are best for you. Grains Baked goods made with fat, such as croissants, muffins, or some breads. Dry pasta or rice meal packs. Vegetables Creamed or fried vegetables. Vegetables in a cheese sauce. Regular canned vegetables (not low-sodium or reduced-sodium). Regular canned tomato sauce and paste (not low-sodium or reduced-sodium). Regular tomato and vegetable juice (not low-sodium or reduced-sodium). Pickles. Olives. Fruits Canned fruit in a light or heavy syrup. Fried fruit. Fruit in cream or butter sauce. Meat and other protein foods Fatty cuts of meat. Ribs. Fried meat. Bacon. Sausage. Bologna and other processed lunch meats. Salami. Fatback. Hotdogs. Bratwurst. Salted nuts and seeds. Canned beans with   added salt. Canned or smoked fish. Whole eggs or egg yolks. Chicken or turkey with skin. Dairy Whole or 2% milk, cream, and half-and-half. Whole or full-fat cream cheese. Whole-fat or sweetened yogurt. Full-fat cheese. Nondairy creamers. Whipped toppings. Processed cheese and cheese spreads. Fats and oils Butter. Stick margarine. Lard. Shortening. Ghee. Bacon fat. Tropical oils, such as coconut, palm kernel, or palm oil. Seasoning and other foods Salted popcorn and pretzels. Onion salt, garlic salt, seasoned salt, table salt, and sea salt. Worcestershire sauce. Tartar sauce. Barbecue sauce. Teriyaki sauce. Soy sauce, including  reduced-sodium. Steak sauce. Canned and packaged gravies. Fish sauce. Oyster sauce. Cocktail sauce. Horseradish that you find on the shelf. Ketchup. Mustard. Meat flavorings and tenderizers. Bouillon cubes. Hot sauce and Tabasco sauce. Premade or packaged marinades. Premade or packaged taco seasonings. Relishes. Regular salad dressings. Where to find more information:  National Heart, Lung, and Blood Institute: www.nhlbi.nih.gov  American Heart Association: www.heart.org Summary  The DASH eating plan is a healthy eating plan that has been shown to reduce high blood pressure (hypertension). It may also reduce your risk for type 2 diabetes, heart disease, and stroke.  With the DASH eating plan, you should limit salt (sodium) intake to 2,300 mg a day. If you have hypertension, you may need to reduce your sodium intake to 1,500 mg a day.  When on the DASH eating plan, aim to eat more fresh fruits and vegetables, whole grains, lean proteins, low-fat dairy, and heart-healthy fats.  Work with your health care provider or diet and nutrition specialist (dietitian) to adjust your eating plan to your individual calorie needs. This information is not intended to replace advice given to you by your health care provider. Make sure you discuss any questions you have with your health care provider. Document Revised: 05/02/2017 Document Reviewed: 05/13/2016 Elsevier Patient Education  2020 Elsevier Inc.  

## 2019-12-24 ENCOUNTER — Telehealth: Payer: Self-pay | Admitting: Internal Medicine

## 2019-12-24 NOTE — Telephone Encounter (Signed)
Lm on vm to call office to schedule fasting lab and BP check with nurse, and schedule a 3 to 6 month follow up with Dr. Shirlee Latch.

## 2019-12-27 ENCOUNTER — Telehealth: Payer: Self-pay | Admitting: Internal Medicine

## 2019-12-27 DIAGNOSIS — J309 Allergic rhinitis, unspecified: Secondary | ICD-10-CM

## 2019-12-27 MED ORDER — LORATADINE 10 MG PO TABS: 10.0000 mg | ORAL_TABLET | Freq: Every day | ORAL | 3 refills | Status: DC | PRN

## 2019-12-27 NOTE — Telephone Encounter (Signed)
Pt called in looking for his refill stated that pharmacy said they did not get it forloratadine (CLARITIN) 10 MG tablet

## 2020-06-13 IMAGING — DX LEFT FOOT - COMPLETE 3+ VIEW
3 series · 3 of 3 positions shown · non-contrast
Comparison: None.

CLINICAL DATA: Left foot pain, no known injury, initial encounter

EXAM:
LEFT FOOT - COMPLETE 3+ VIEW

[foot ap]
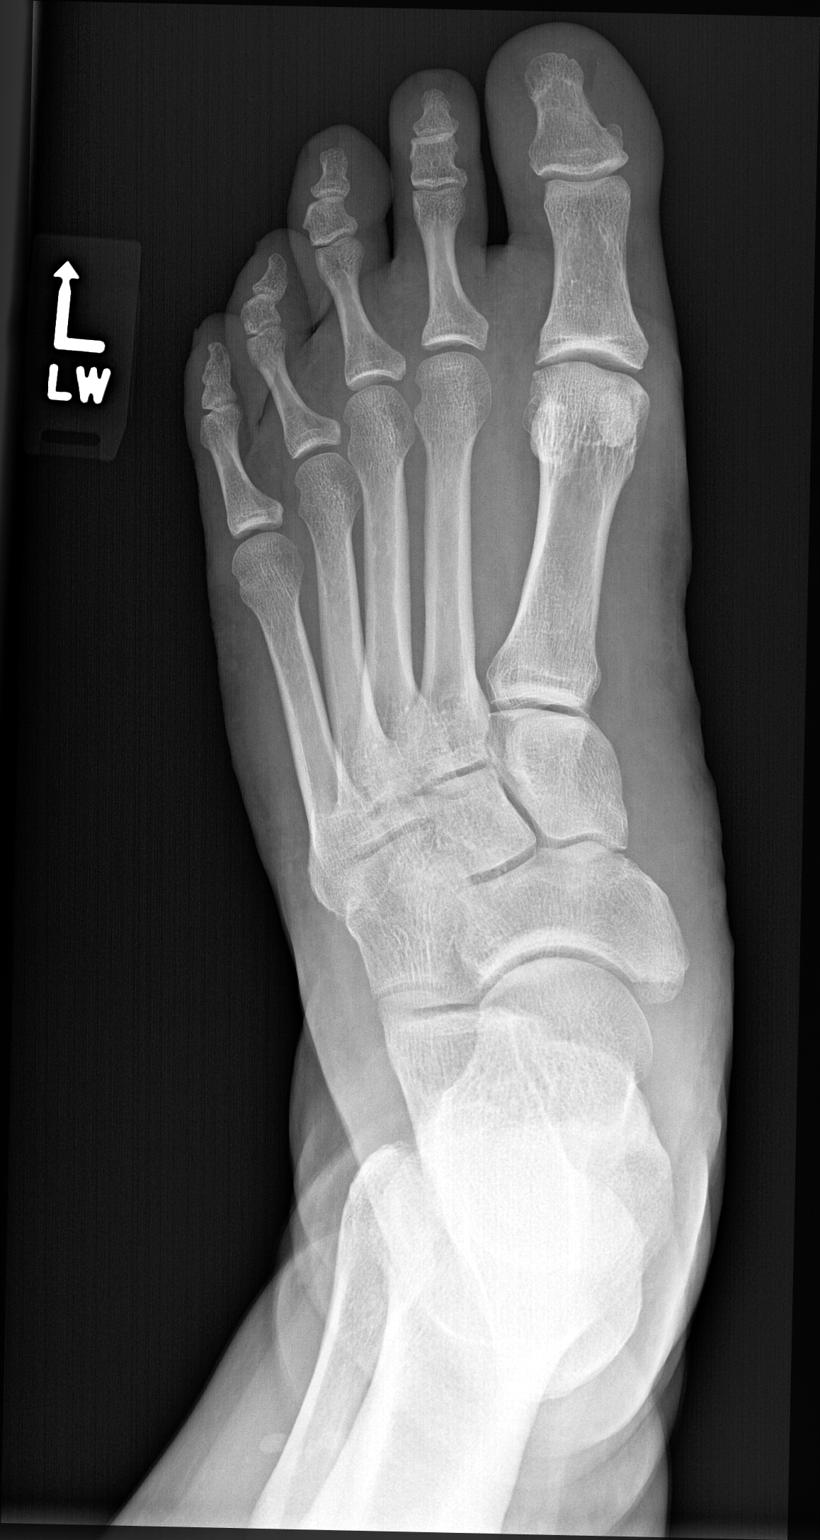

[foot obl (oblique)]
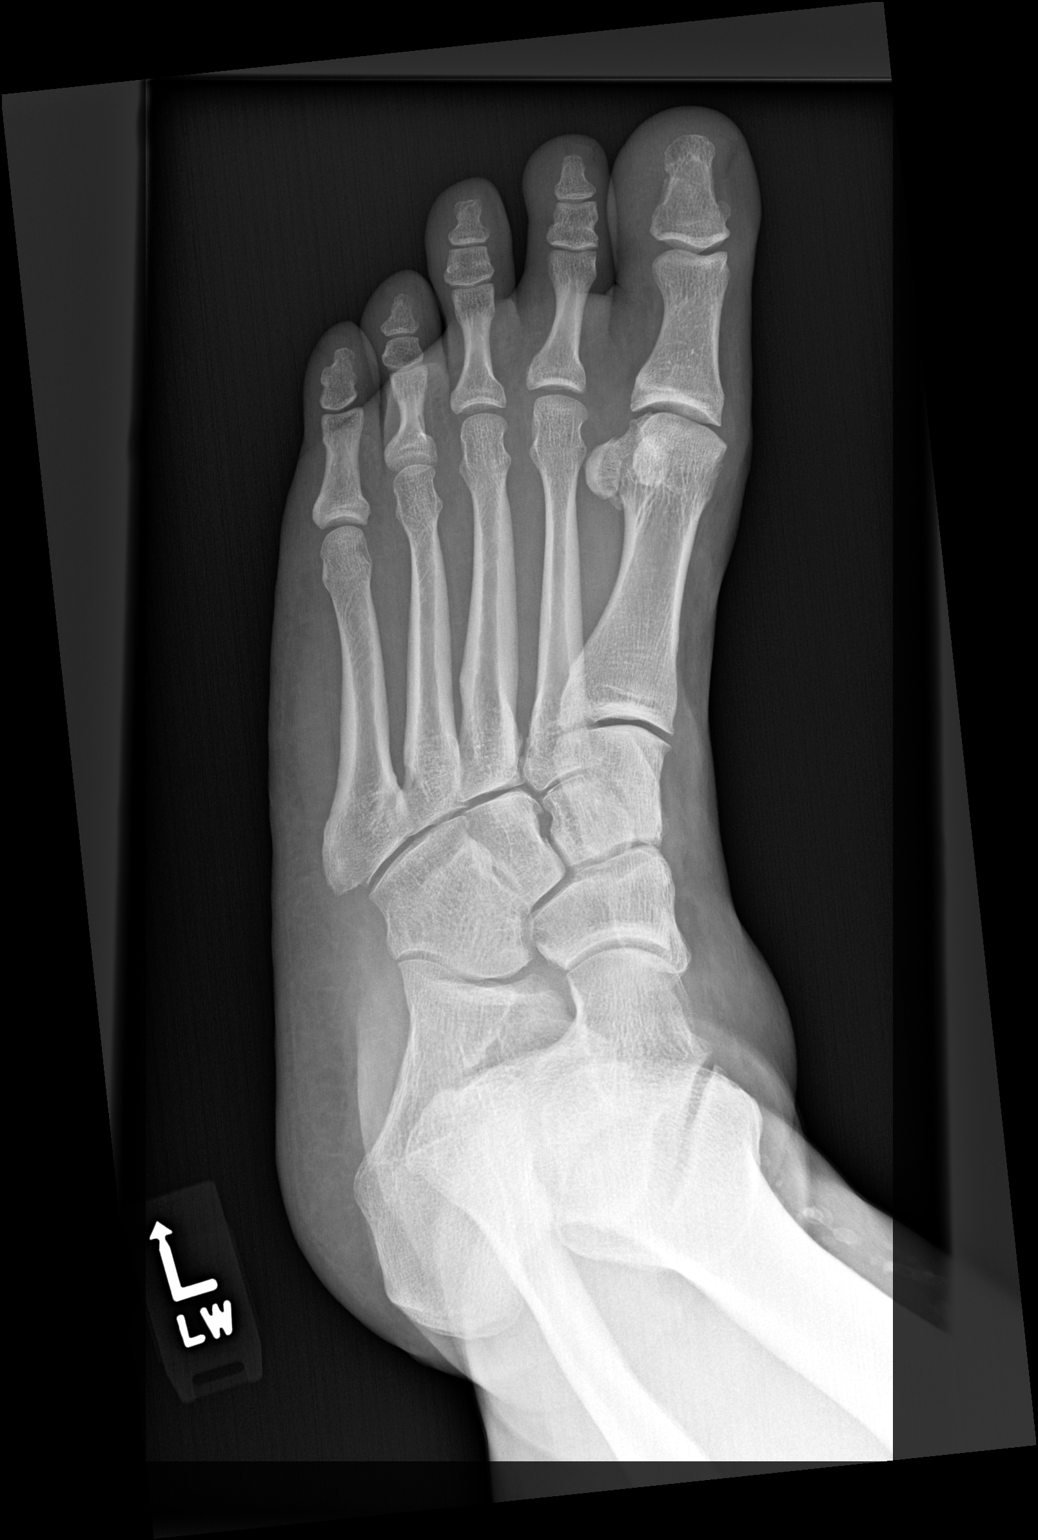

[foot lat]
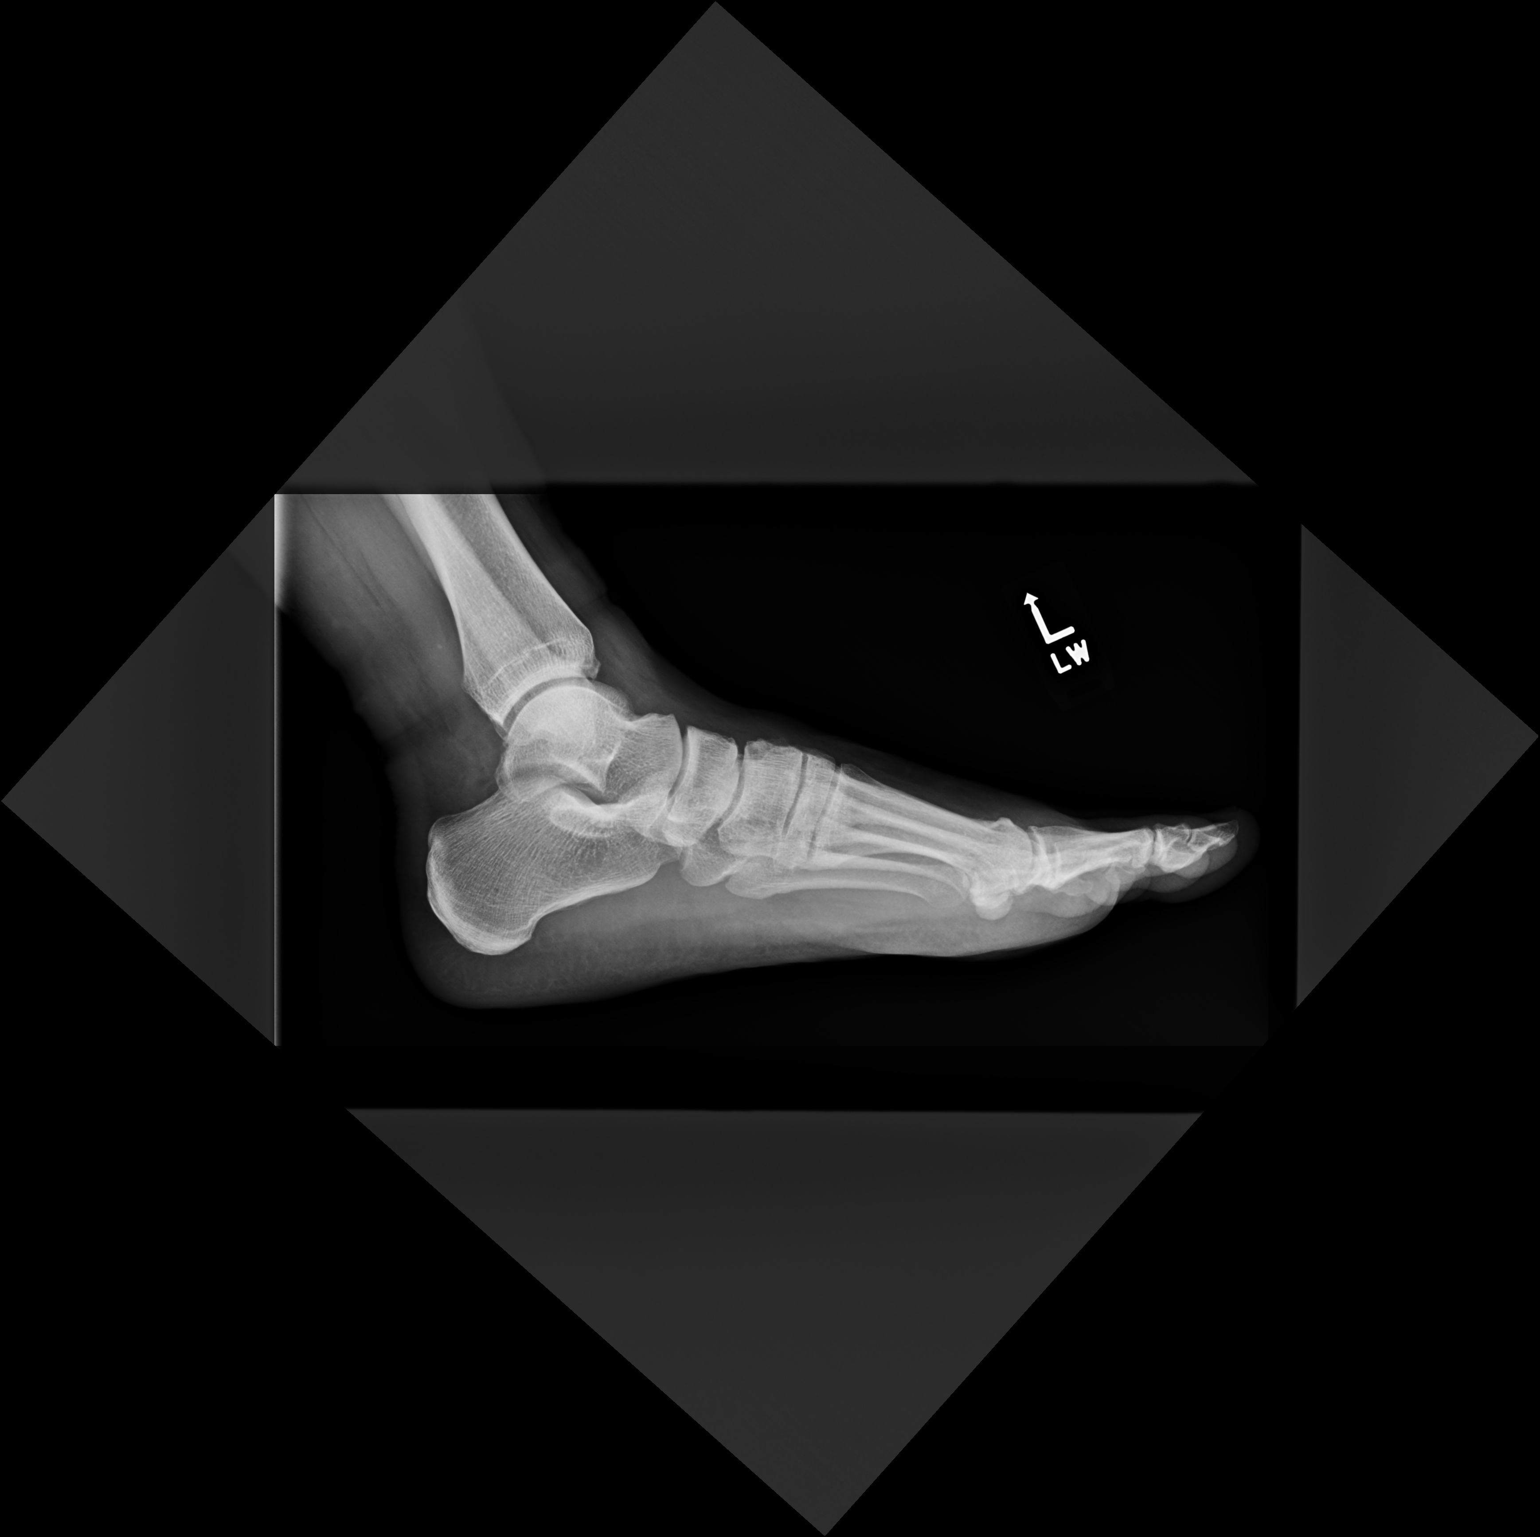

[3 of 3 positions shown; findings below may reference images not displayed]

FINDINGS: There is no evidence of fracture or dislocation. There is no
evidence of arthropathy or other focal bone abnormality. Soft
tissues are unremarkable.
IMPRESSION: No acute abnormality noted.

## 2020-06-13 IMAGING — DX LEFT KNEE - COMPLETE 4+ VIEW
5 series · 5 of 5 positions shown · non-contrast
Comparison: None.

CLINICAL DATA: Left knee pain, no known injury, initial encounter

EXAM:
LEFT KNEE - COMPLETE 4+ VIEW

[knee standing ap]
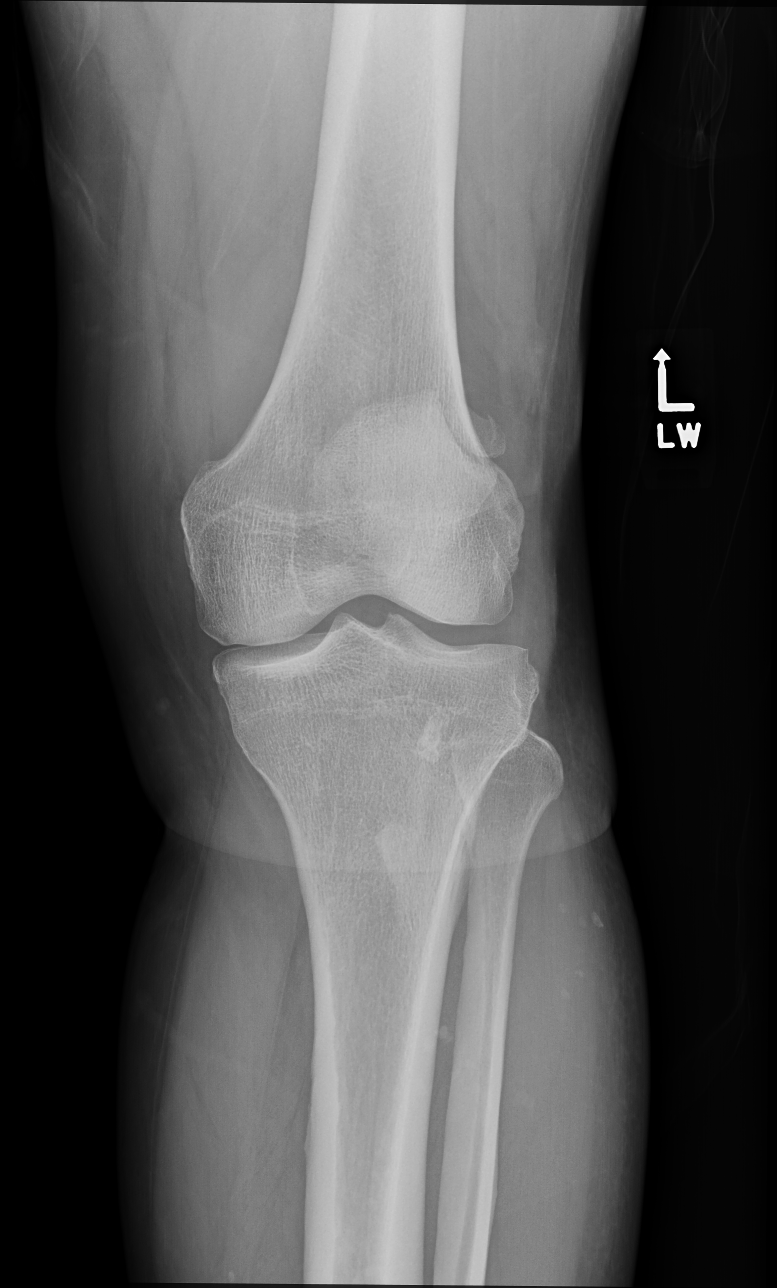

[knee standing external ap]
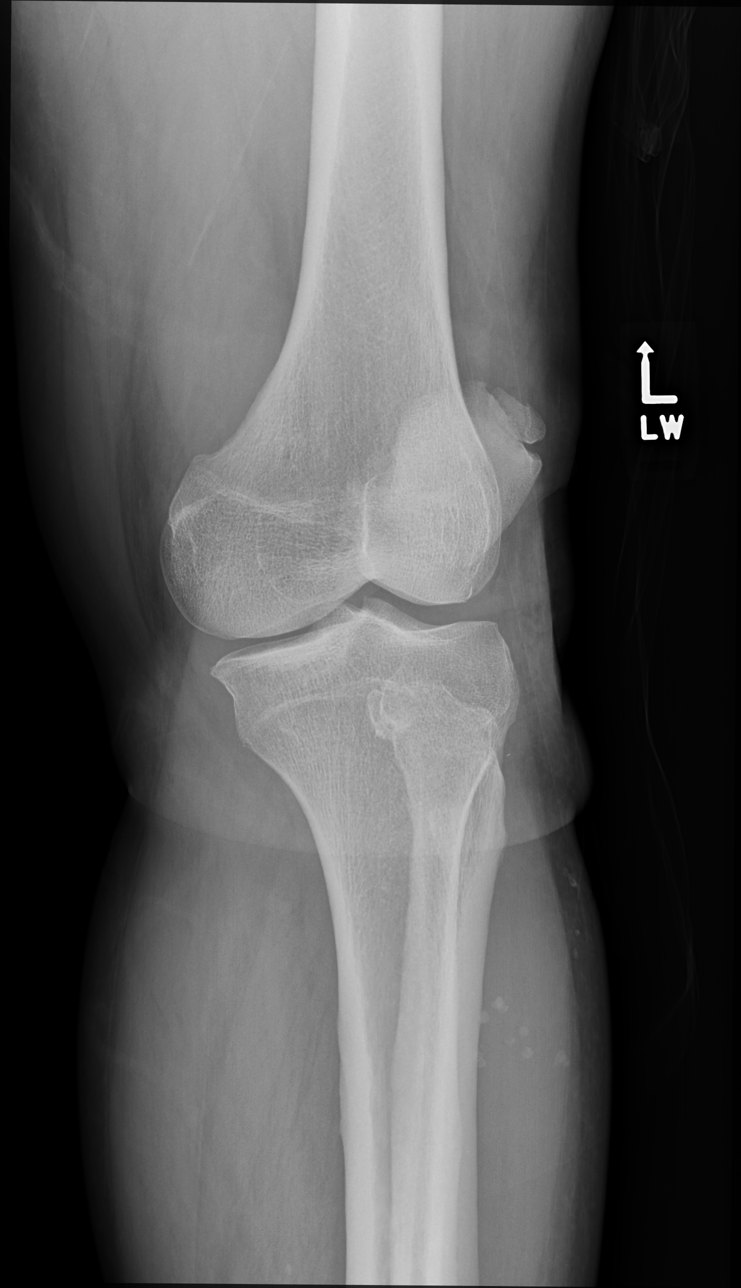

[knee standing internal ap]
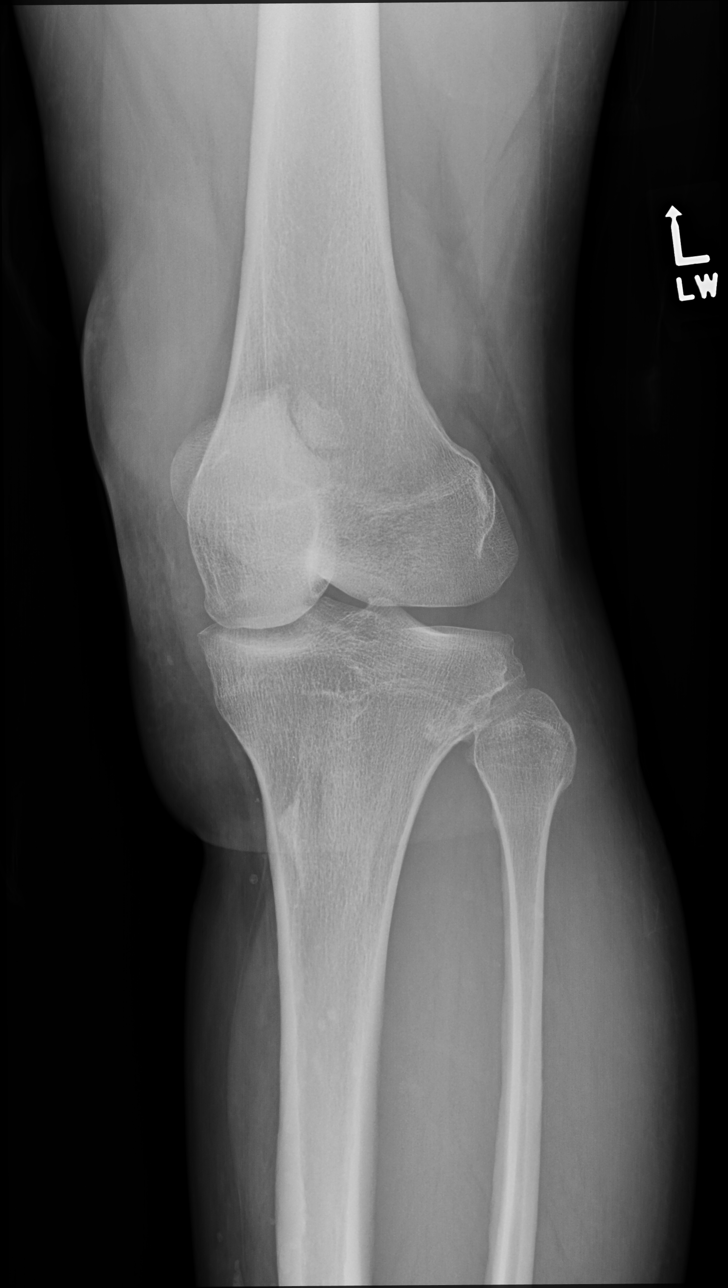

[knee standing lat]
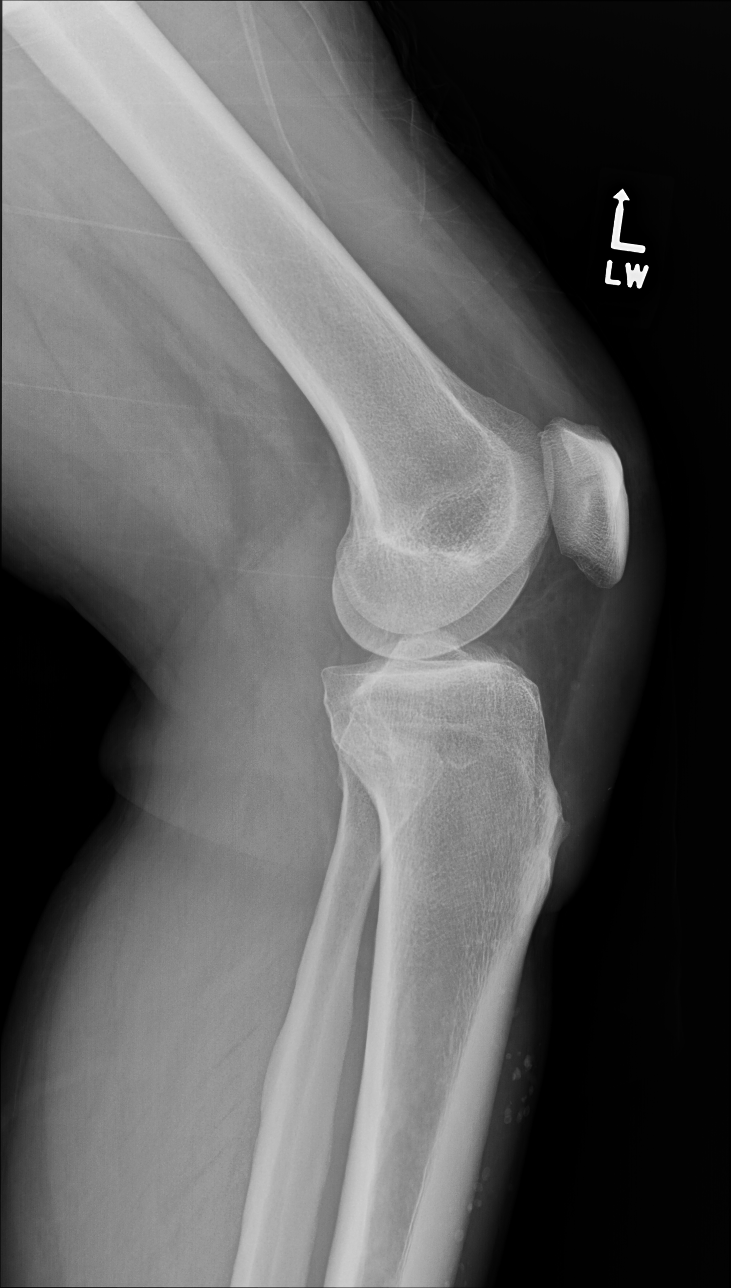

[knee [person_name] view pa]
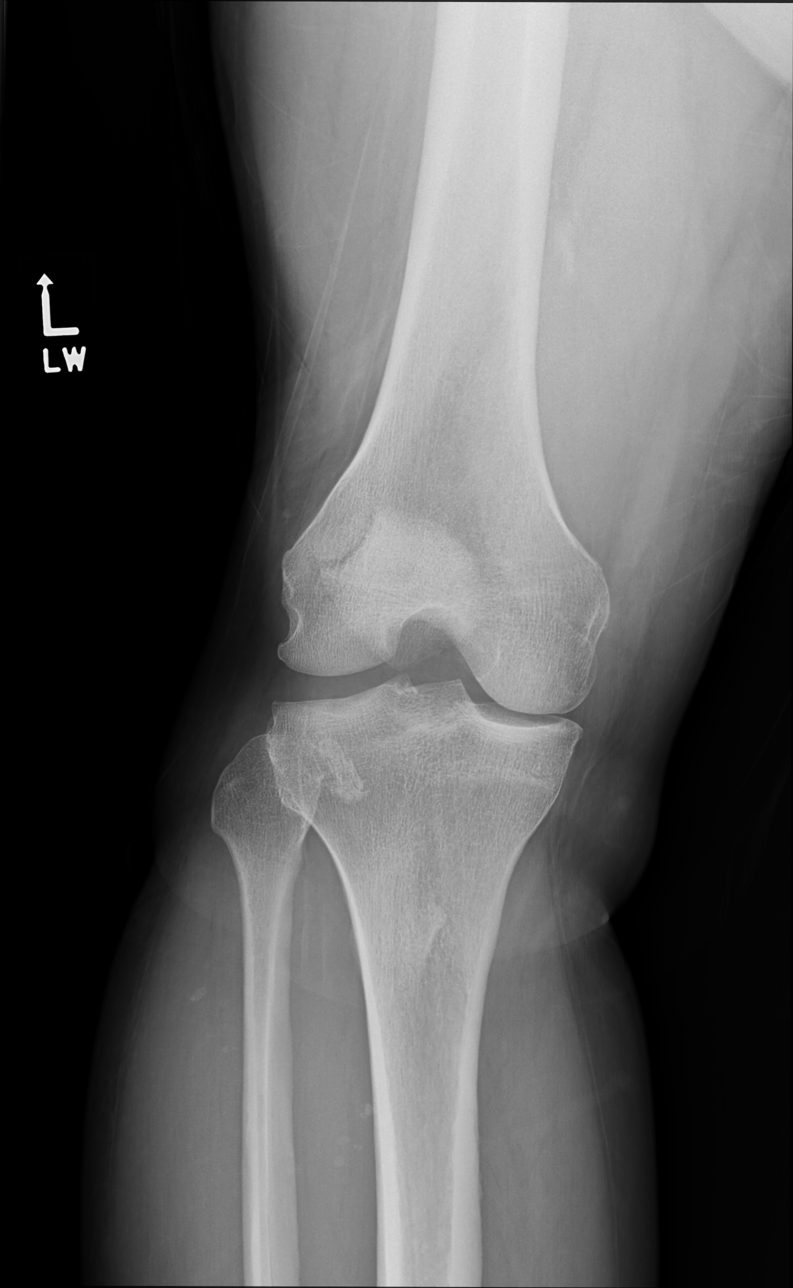

[5 of 5 positions shown; findings below may reference images not displayed]

FINDINGS: Bipartite patella is noted laterally. No acute fracture or
dislocation is seen. No joint effusion is noted.
IMPRESSION: No acute abnormality noted.

## 2020-06-29 ENCOUNTER — Telehealth: Payer: Self-pay

## 2020-06-29 DIAGNOSIS — I10 Essential (primary) hypertension: Secondary | ICD-10-CM

## 2020-06-29 MED ORDER — LISINOPRIL 30 MG PO TABS: 30.0000 mg | ORAL_TABLET | Freq: Every day | ORAL | 1 refills | Status: DC

## 2020-06-29 NOTE — Telephone Encounter (Signed)
Pt needs refill on lisinopril sent to [facility]

## 2020-06-29 NOTE — Addendum Note (Signed)
Addended byElise Benne T on: 06/29/2020 09:48 AM   Modules accepted: Orders

## 2020-12-12 ENCOUNTER — Other Ambulatory Visit: Payer: Self-pay | Admitting: Internal Medicine

## 2020-12-12 DIAGNOSIS — I10 Essential (primary) hypertension: Secondary | ICD-10-CM

## 2020-12-25 ENCOUNTER — Other Ambulatory Visit: Payer: Self-pay | Admitting: Internal Medicine

## 2020-12-25 DIAGNOSIS — I10 Essential (primary) hypertension: Secondary | ICD-10-CM

## 2020-12-25 DIAGNOSIS — J309 Allergic rhinitis, unspecified: Secondary | ICD-10-CM

## 2021-01-02 ENCOUNTER — Other Ambulatory Visit: Payer: Self-pay | Admitting: Internal Medicine

## 2021-01-02 DIAGNOSIS — J309 Allergic rhinitis, unspecified: Secondary | ICD-10-CM

## 2021-03-26 ENCOUNTER — Other Ambulatory Visit: Payer: Self-pay | Admitting: Internal Medicine

## 2021-03-26 DIAGNOSIS — J309 Allergic rhinitis, unspecified: Secondary | ICD-10-CM

## 2021-06-23 ENCOUNTER — Other Ambulatory Visit: Payer: Self-pay | Admitting: Internal Medicine

## 2021-06-23 DIAGNOSIS — I10 Essential (primary) hypertension: Secondary | ICD-10-CM

## 2021-06-23 DIAGNOSIS — J309 Allergic rhinitis, unspecified: Secondary | ICD-10-CM

## 2021-08-29 ENCOUNTER — Telehealth: Payer: Self-pay | Admitting: Internal Medicine

## 2021-08-29 DIAGNOSIS — R7303 Prediabetes: Secondary | ICD-10-CM

## 2021-08-29 DIAGNOSIS — Z1389 Encounter for screening for other disorder: Secondary | ICD-10-CM

## 2021-08-29 DIAGNOSIS — Z1329 Encounter for screening for other suspected endocrine disorder: Secondary | ICD-10-CM

## 2021-08-29 DIAGNOSIS — Z125 Encounter for screening for malignant neoplasm of prostate: Secondary | ICD-10-CM

## 2021-08-29 DIAGNOSIS — Z0000 Encounter for general adult medical examination without abnormal findings: Secondary | ICD-10-CM

## 2021-08-29 DIAGNOSIS — E559 Vitamin D deficiency, unspecified: Secondary | ICD-10-CM

## 2021-08-29 DIAGNOSIS — E785 Hyperlipidemia, unspecified: Secondary | ICD-10-CM

## 2021-08-29 DIAGNOSIS — I10 Essential (primary) hypertension: Secondary | ICD-10-CM

## 2021-08-29 NOTE — Telephone Encounter (Signed)
I have not seen pt since 12/2019 needs physical and received paperwork to sign off on cpap he needs appt for this  ? ? ?Call and schedule please  ? ? ? ?

## 2021-08-31 NOTE — Addendum Note (Signed)
Addended by: Tilford Pillar on: 08/31/2021 11:10 AM ? ? Modules accepted: Orders ? ?

## 2021-08-31 NOTE — Telephone Encounter (Signed)
Patient scheduled for physical 09/13/21 and fasting labs 09/10/21.  ?

## 2021-09-10 ENCOUNTER — Other Ambulatory Visit (INDEPENDENT_AMBULATORY_CARE_PROVIDER_SITE_OTHER): Payer: BC Managed Care – PPO

## 2021-09-10 DIAGNOSIS — Z125 Encounter for screening for malignant neoplasm of prostate: Secondary | ICD-10-CM

## 2021-09-10 DIAGNOSIS — E559 Vitamin D deficiency, unspecified: Secondary | ICD-10-CM | POA: Diagnosis not present

## 2021-09-10 DIAGNOSIS — R7303 Prediabetes: Secondary | ICD-10-CM | POA: Diagnosis not present

## 2021-09-10 DIAGNOSIS — E785 Hyperlipidemia, unspecified: Secondary | ICD-10-CM

## 2021-09-10 DIAGNOSIS — Z1389 Encounter for screening for other disorder: Secondary | ICD-10-CM

## 2021-09-10 DIAGNOSIS — Z1329 Encounter for screening for other suspected endocrine disorder: Secondary | ICD-10-CM | POA: Diagnosis not present

## 2021-09-10 DIAGNOSIS — Z0000 Encounter for general adult medical examination without abnormal findings: Secondary | ICD-10-CM

## 2021-09-10 DIAGNOSIS — I10 Essential (primary) hypertension: Secondary | ICD-10-CM | POA: Diagnosis not present

## 2021-09-10 LAB — CBC WITH DIFFERENTIAL/PLATELET
Basophils Absolute: 0.1 10*3/uL (ref 0.0–0.1)
Basophils Relative: 0.7 % (ref 0.0–3.0)
Eosinophils Absolute: 0.3 10*3/uL (ref 0.0–0.7)
Eosinophils Relative: 3.8 % (ref 0.0–5.0)
HCT: 45.6 % (ref 39.0–52.0)
Hemoglobin: 15.6 g/dL (ref 13.0–17.0)
Lymphocytes Relative: 22.9 % (ref 12.0–46.0)
Lymphs Abs: 1.7 10*3/uL (ref 0.7–4.0)
MCHC: 34.3 g/dL (ref 30.0–36.0)
MCV: 92.6 fl (ref 78.0–100.0)
Monocytes Absolute: 0.6 10*3/uL (ref 0.1–1.0)
Monocytes Relative: 8.3 % (ref 3.0–12.0)
Neutro Abs: 4.9 10*3/uL (ref 1.4–7.7)
Neutrophils Relative %: 64.3 % (ref 43.0–77.0)
Platelets: 272 10*3/uL (ref 150.0–400.0)
RBC: 4.92 Mil/uL (ref 4.22–5.81)
RDW: 13.5 % (ref 11.5–15.5)
WBC: 7.6 10*3/uL (ref 4.0–10.5)

## 2021-09-10 LAB — COMPREHENSIVE METABOLIC PANEL
ALT: 50 U/L (ref 0–53)
AST: 26 U/L (ref 0–37)
Albumin: 4.2 g/dL (ref 3.5–5.2)
Alkaline Phosphatase: 38 U/L — ABNORMAL LOW (ref 39–117)
BUN: 18 mg/dL (ref 6–23)
CO2: 25 mEq/L (ref 19–32)
Calcium: 9.4 mg/dL (ref 8.4–10.5)
Chloride: 103 mEq/L (ref 96–112)
Creatinine, Ser: 0.92 mg/dL (ref 0.40–1.50)
GFR: 90.18 mL/min (ref >60.00)
Glucose, Bld: 157 mg/dL — ABNORMAL HIGH (ref 70–99)
Potassium: 4.4 mEq/L (ref 3.5–5.1)
Sodium: 138 mEq/L (ref 135–145)
Total Bilirubin: 0.5 mg/dL (ref 0.2–1.2)
Total Protein: 6.4 g/dL (ref 6.0–8.3)

## 2021-09-10 LAB — TSH: TSH: 2.98 u[IU]/mL (ref 0.35–5.50)

## 2021-09-10 LAB — LIPID PANEL
Cholesterol: 163 mg/dL (ref 0–200)
HDL: 37.9 mg/dL — ABNORMAL LOW (ref >39.00)
LDL Cholesterol: 98 mg/dL (ref 0–99)
NonHDL: 125.08
Total CHOL/HDL Ratio: 4
Triglycerides: 133 mg/dL (ref 0.0–149.0)
VLDL: 26.6 mg/dL (ref 0.0–40.0)

## 2021-09-10 LAB — URINALYSIS, ROUTINE W REFLEX MICROSCOPIC
Bilirubin Urine: NEGATIVE
Hgb urine dipstick: NEGATIVE
Ketones, ur: NEGATIVE
Leukocytes,Ua: NEGATIVE
Nitrite: NEGATIVE
RBC / HPF: NONE SEEN (ref 0–?)
Specific Gravity, Urine: 1.025 (ref 1.000–1.030)
Total Protein, Urine: NEGATIVE
Urine Glucose: NEGATIVE
Urobilinogen, UA: 0.2 (ref 0.0–1.0)
pH: 5.5 (ref 5.0–8.0)

## 2021-09-10 LAB — HEMOGLOBIN A1C: Hgb A1c MFr Bld: 7.3 % — ABNORMAL HIGH (ref 4.6–6.5)

## 2021-09-10 LAB — VITAMIN D 25 HYDROXY (VIT D DEFICIENCY, FRACTURES): VITD: 47.81 ng/mL (ref 30.00–100.00)

## 2021-09-10 LAB — PSA: PSA: 0.82 ng/mL (ref 0.10–4.00)

## 2021-09-10 NOTE — Progress Notes (Signed)
Liver kidneys normal  ?Cholesterol normal  ?Vit D normal  ?Blood cts normal  ?A1c 7.3=diabetes  ?-can disc tx options at his upcoming appt  ?Thyroid lab normal  ?PSA normal  ?Urine normal  ?

## 2021-09-13 ENCOUNTER — Encounter: Payer: Self-pay | Admitting: Internal Medicine

## 2021-09-13 ENCOUNTER — Ambulatory Visit (INDEPENDENT_AMBULATORY_CARE_PROVIDER_SITE_OTHER): Payer: BC Managed Care – PPO | Admitting: Internal Medicine

## 2021-09-13 VITALS — BP 130/84 | HR 84 | Temp 98.4°F | Resp 14 | Ht 70.0 in | Wt 361.6 lb

## 2021-09-13 DIAGNOSIS — E1159 Type 2 diabetes mellitus with other circulatory complications: Secondary | ICD-10-CM | POA: Diagnosis not present

## 2021-09-13 DIAGNOSIS — Z0000 Encounter for general adult medical examination without abnormal findings: Secondary | ICD-10-CM

## 2021-09-13 DIAGNOSIS — I10 Essential (primary) hypertension: Secondary | ICD-10-CM

## 2021-09-13 DIAGNOSIS — Z23 Encounter for immunization: Secondary | ICD-10-CM

## 2021-09-13 DIAGNOSIS — G4733 Obstructive sleep apnea (adult) (pediatric): Secondary | ICD-10-CM

## 2021-09-13 DIAGNOSIS — I152 Hypertension secondary to endocrine disorders: Secondary | ICD-10-CM | POA: Insufficient documentation

## 2021-09-13 DIAGNOSIS — Z9989 Dependence on other enabling machines and devices: Secondary | ICD-10-CM

## 2021-09-13 MED ORDER — OZEMPIC (0.25 OR 0.5 MG/DOSE) 2 MG/3ML ~~LOC~~ SOPN: 0.2500 mg | PEN_INJECTOR | SUBCUTANEOUS | 2 refills | Status: DC

## 2021-09-13 MED ORDER — BLOOD GLUCOSE METER KIT: PACK | 0 refills | Status: AC

## 2021-09-13 MED ORDER — LISINOPRIL 30 MG PO TABS: 30.0000 mg | ORAL_TABLET | Freq: Every day | ORAL | 3 refills | Status: DC

## 2021-09-13 NOTE — Progress Notes (Signed)
Chief Complaint  ?Patient presents with  ? Annual Exam  ?  Feeling overall, labs completed.   ? ?Annual  ?1. Htn on lis 30 mg qd at home 120/s70s ?2. 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 Reviewed labs dm 2 A1c >7 agreeable to ozempic ? ?Review of Systems  ?Constitutional:  Negative for weight loss.  ?HENT:  Negative for hearing loss.   ?Eyes:  Negative for blurred vision.  ?Respiratory:  Negative for shortness of breath.   ?Cardiovascular:  Negative for chest pain.  ?Gastrointestinal:  Negative for abdominal pain and blood in stool.  ?Genitourinary:  Negative for dysuria.  ?Musculoskeletal:  Negative for back pain, falls and joint pain.  ?Skin:  Negative for rash.  ?Neurological:  Negative for headaches.  ?Psychiatric/Behavioral:  Negative for depression.   ?Past Medical History:  ?Diagnosis Date  ? Arthritis   ? COVID-19   ? 12/2020  ? Essential hypertension   ? Hyperlipidemia   ? Left knee pain   ? Lymphedema   ? Morbid obesity with BMI of 50.0-59.9, adult ([place])   ? OSA on CPAP   ? ?Past Surgical History:  ?Procedure Laterality Date  ? knee surgery    ? left 61 y.o   ? UMBILICAL HERNIA REPAIR    ? ?Family History  ?Problem Relation Age of Onset  ? Hypertension Mother   ? Heart disease Mother   ? ?Social History  ? ?Socioeconomic History  ? Marital status: Married  ?  Spouse name: Not on file  ? Number of children: Not on file  ? Years of education: Not on file  ? Highest education level: Not on file  ?Occupational History  ? Not on file  ?Tobacco Use  ? Smoking status: Never  ? Smokeless tobacco: Never  ?Substance and Sexual Activity  ? Alcohol use: No  ? Drug use: No  ? Sexual activity: Yes  ?  Partners: Female  ?Other Topics Concern  ? Not on file  ?Social History Narrative  ? Married   ? Kids (daughter)   ? grandson  ? Dump Truck driver -Engineer, materials  ?   ? ?Social Determinants of Health  ? ?Financial Resource Strain: Not on file  ?Food Insecurity: Not on file   ?Transportation Needs: Not on file  ?Physical Activity: Not on file  ?Stress: Not on file  ?Social Connections: Not on file  ?Intimate Partner Violence: Not on file  ? ?Current Meds  ?Medication Sig  ? aspirin 81 MG chewable tablet Chew 81 mg by mouth daily.  ? Azelastine HCl 0.15 % SOLN Place 1-2 sprays into the nose daily as needed. If 2 sprays only use 1x per day.  ? blood glucose meter kit and supplies Dispense based on patient and insurance preference. Use up to four times daily as directed. (FOR ICD-10 E10.9, E11.9). 1 year lancets and strips  ? loratadine (CLARITIN) 10 MG tablet TAKE 1 TABLET BY MOUTH ONCE DAILY AS NEEDED FOR ALLERGIES  ? Semaglutide,0.25 or 0.5MG/DOS, (OZEMPIC, 0.25 OR 0.5 MG/DOSE,) 2 MG/3ML SOPN Inject 0.25 mg into the skin once a week. X 1 month increase dose to 0.5 weekly after 1 month if tolerating  ? [DISCONTINUED] Cholecalciferol 1.25 MG (50000 UT) capsule Take 1 capsule (50,000 Units total) by mouth once a week.  ? [DISCONTINUED] lisinopril (ZESTRIL) 30 MG tablet Take 1 tablet by mouth once daily  ? ?No Known Allergies ?Recent Results (from  the past 2160 hour(s))  ?Comprehensive metabolic panel     Status: Abnormal  ? Collection Time: 09/10/21  7:36 AM  ?Result Value Ref Range  ? Sodium 138 135 - 145 mEq/L  ? Potassium 4.4 3.5 - 5.1 mEq/L  ? Chloride 103 96 - 112 mEq/L  ? CO2 25 19 - 32 mEq/L  ? Glucose, Bld 157 (H) 70 - 99 mg/dL  ? BUN 18 6 - 23 mg/dL  ? Creatinine, Ser 0.92 0.40 - 1.50 mg/dL  ? Total Bilirubin 0.5 0.2 - 1.2 mg/dL  ? Alkaline Phosphatase 38 (L) 39 - 117 U/L  ? AST 26 0 - 37 U/L  ? ALT 50 0 - 53 U/L  ? Total Protein 6.4 6.0 - 8.3 g/dL  ? Albumin 4.2 3.5 - 5.2 g/dL  ? GFR 90.18 >60.00 mL/min  ?  Comment: Calculated using the CKD-EPI Creatinine Equation (2021)  ? Calcium 9.4 8.4 - 10.5 mg/dL  ?VITAMIN D 25 Hydroxy (Vit-D Deficiency, Fractures)     Status: None  ? Collection Time: 09/10/21  7:36 AM  ?Result Value Ref Range  ? VITD 47.81 30.00 - 100.00 ng/mL  ?PSA      Status: None  ? Collection Time: 09/10/21  7:36 AM  ?Result Value Ref Range  ? PSA 0.82 0.10 - 4.00 ng/mL  ?  Comment: Test performed using Access Hybritech PSA Assay, a parmagnetic partical, chemiluminecent immunoassay.  ?Hemoglobin A1c     Status: Abnormal  ? Collection Time: 09/10/21  7:36 AM  ?Result Value Ref Range  ? Hgb A1c MFr Bld 7.3 (H) 4.6 - 6.5 %  ?  Comment: Glycemic Control Guidelines for People with Diabetes:Non Diabetic:  <6%Goal of Therapy: <7%Additional Action Suggested:  >8%   ?CBC with Differential/Platelet     Status: None  ? Collection Time: 09/10/21  7:36 AM  ?Result Value Ref Range  ? WBC 7.6 4.0 - 10.5 K/uL  ? RBC 4.92 4.22 - 5.81 Mil/uL  ? Hemoglobin 15.6 13.0 - 17.0 g/dL  ? HCT 45.6 39.0 - 52.0 %  ? MCV 92.6 78.0 - 100.0 fl  ? MCHC 34.3 30.0 - 36.0 g/dL  ? RDW 13.5 11.5 - 15.5 %  ? Platelets 272.0 150.0 - 400.0 K/uL  ? Neutrophils Relative % 64.3 43.0 - 77.0 %  ? Lymphocytes Relative 22.9 12.0 - 46.0 %  ? Monocytes Relative 8.3 3.0 - 12.0 %  ? Eosinophils Relative 3.8 0.0 - 5.0 %  ? Basophils Relative 0.7 0.0 - 3.0 %  ? Neutro Abs 4.9 1.4 - 7.7 K/uL  ? Lymphs Abs 1.7 0.7 - 4.0 K/uL  ? Monocytes Absolute 0.6 0.1 - 1.0 K/uL  ? Eosinophils Absolute 0.3 0.0 - 0.7 K/uL  ? Basophils Absolute 0.1 0.0 - 0.1 K/uL  ?Lipid panel     Status: Abnormal  ? Collection Time: 09/10/21  7:36 AM  ?Result Value Ref Range  ? Cholesterol 163 0 - 200 mg/dL  ?  Comment: ATP III Classification       Desirable:  < 200 mg/dL               Borderline High:  200 - 239 mg/dL          High:  > = 240 mg/dL  ? Triglycerides 133.0 0.0 - 149.0 mg/dL  ?  Comment: Normal:  <150 mg/dLBorderline High:  150 - 199 mg/dL  ? HDL 37.90 (L) >39.00 mg/dL  ? VLDL 26.6 0.0 - 40.0 mg/dL  ?  LDL Cholesterol 98 0 - 99 mg/dL  ? Total CHOL/HDL Ratio 4   ?  Comment:                Men          Women1/2 Average Risk     3.4          3.3Average Risk          5.0          4.42X Average Risk          9.6          7.13X Average Risk          15.0           11.0                      ? NonHDL 125.08   ?  Comment: NOTE:  Non-HDL goal should be 30 mg/dL higher than patient's LDL goal (i.e. LDL goal of < 70 mg/dL, would have non-HDL goal of < 100 mg/dL)  ?Urinalysis, Routine w reflex microscopic     Status: None  ? Collection Time: 09/10/21  7:36 AM  ?Result Value Ref Range  ? Color, Urine YELLOW Yellow;Lt. Yellow;Straw;Dark Yellow;Amber;Green;Red;Brown  ? APPearance CLEAR Clear;Turbid;Slightly Cloudy;Cloudy  ? Specific Gravity, Urine 1.025 1.000 - 1.030  ? pH 5.5 5.0 - 8.0  ? Total Protein, Urine NEGATIVE Negative  ? Urine Glucose NEGATIVE Negative  ? Ketones, ur NEGATIVE Negative  ? Bilirubin Urine NEGATIVE Negative  ? Hgb urine dipstick NEGATIVE Negative  ? Urobilinogen, UA 0.2 0.0 - 1.0  ? Leukocytes,Ua NEGATIVE Negative  ? Nitrite NEGATIVE Negative  ? WBC, UA 0-2/hpf 0-2/hpf  ? RBC / HPF none seen 0-2/hpf  ? Squamous Epithelial / LPF Rare(0-4/hpf) Rare(0-4/hpf)  ?TSH     Status: None  ? Collection Time: 09/10/21  7:36 AM  ?Result Value Ref Range  ? TSH 2.98 0.35 - 5.50 uIU/mL  ? ?Objective  ?Body mass index is 51.88 kg/m?. ?Wt Readings from Last 3 Encounters:  ?09/13/21 (!) 361 lb 9.6 oz (164 kg)  ?12/23/19 (!) 350 lb (158.8 kg)  ?04/17/18 (!) 375 lb 9.6 oz (170.4 kg)  ? ?Temp Readings from Last 3 Encounters:  ?09/13/21 98.4 ?F (36.9 ?C) (Oral)  ?04/17/18 (!) 97.5 ?F (36.4 ?C) (Oral)  ?07/14/17 98 ?F (36.7 ?C) (Oral)  ? ?BP Readings from Last 3 Encounters:  ?09/13/21 130/84  ?04/17/18 140/86  ?07/14/17 136/84  ? ?Pulse Readings from Last 3 Encounters:  ?09/13/21 84  ?04/17/18 97  ?07/14/17 86  ? ? ?Physical Exam ?Vitals and nursing note reviewed.  ?Constitutional:   ?   Appearance: Normal appearance. He is well-developed and well-groomed.  ?HENT:  ?   Head: Normocephalic and atraumatic.  ?Eyes:  ?   Conjunctiva/sclera: Conjunctivae normal.  ?   Pupils: Pupils are equal, round, and reactive to light.  ?Cardiovascular:  ?   Rate and Rhythm: Normal rate and regular  rhythm.  ?   Heart sounds: Normal heart sounds.  ?Pulmonary:  ?   Effort: Pulmonary effort is normal. No respiratory distress.  ?   Breath sounds: Normal breath sounds.  ?Abdominal:  ?   Tenderness: There is no a

## 2021-09-13 NOTE — Patient Instructions (Addendum)
Latest Reference Range & Units 09/10/21 07:36  ?Hemoglobin A1C 4.6 - 6.5 % 7.3 (H)  ?(H): Data is abnormally high ? ?In am sugar fasting 90 and <130  ?After meals <180  ?Cinnamon capsules 500 mg 1 to 2x per day ? ? ?Call me back when ready for colonoscopy [facility]  ? ?Sleep study  ? ?Phone Fax E-mail Address  ?[id number] [phone] Not available [address]  ? [place] [address]  ?   ?Specialties     ? ?Sore Throat ?A sore throat is pain, burning, irritation, or scratchiness in the throat. When you have a sore throat, you may feel pain or tenderness in your throat when you swallow or talk. ?Many things can cause a sore throat, including: ?An infection. ?Seasonal allergies. ?Dryness in the air. ?Irritants, such as smoke or pollution. ?Radiation treatment for cancer. ?Gastroesophageal reflux disease (GERD). ?A tumor. ?A sore throat is often the first sign of another sickness. It may happen with other symptoms, such as coughing, sneezing, fever, and swollen neck glands. Most sore throats go away without medical treatment. ?Follow these instructions at home: ?  ?Medicines ?Take over-the-counter and prescription medicines only as told by your health care provider. ?Children often get sore throats. Do not give your child aspirin because of the association with Reye's syndrome. ?Use throat sprays to soothe your throat as told by your health care provider. ?Managing pain ?To help with pain, try: ?Sipping warm liquids, such as broth, herbal tea, or warm water. ?Eating or drinking cold or frozen liquids, such as frozen ice pops. ?Gargling with a mixture of salt and water 3-4 times a day or as needed. To make salt water, completely dissolve ?-1 tsp (3-6 g) of salt in 1 cup (237 mL) of warm water. ?Sucking on hard candy or throat lozenges. ?Putting a cool-mist humidifier in your bedroom at night to moisten the air. ?Sitting in the bathroom with the door closed for 5-10 minutes while you run  hot water in the shower. ?General instructions ?Do not use any products that contain nicotine or tobacco. These products include cigarettes, chewing tobacco, and vaping devices, such as e-cigarettes. If you need help quitting, ask your health care provider. ?Rest as needed. ?Drink enough fluid to keep your urine pale yellow. ?Wash your hands often with soap and water for at least 20 seconds. If soap and water are not available, use hand sanitizer. ?Contact a health care provider if: ?You have a fever for more than 2-3 days. ?You have symptoms that last for more than 2-3 days. ?Your throat does not get better within 7 days. ?You have a fever and your symptoms suddenly get worse. ?Get help right away if: ?You have difficulty breathing. ?You cannot swallow fluids, soft foods, or your saliva. ?You have increased swelling in your throat or neck. ?You have persistent nausea and vomiting. ?These symptoms may represent a serious problem that is an emergency. Do not wait to see if the symptoms will go away. Get medical help right away. Call your local emergency services (911 in the [place] Do not drive yourself to the hospital. ?Summary ?A sore throat is pain, burning, irritation, or scratchiness in the throat. Many things can cause a sore throat. ?Take over-the-counter medicines only as told by your health care provider. ?Rest as needed. ?Drink enough fluid to keep your urine pale yellow. ?Contact a health care provider if your throat does not get better within 7 days. ?This  information is not intended to replace advice given to you by your health care provider. Make sure you discuss any questions you have with your health care provider. ?Document Revised: 08/16/2020 Document Reviewed: 08/16/2020 ?Elsevier Patient Education ? [place]. ? ? ?Semaglutide Injection ?What is this medication? ?SEMAGLUTIDE (SEM a GLOO tide) treats type 2 diabetes. It works by increasing insulin levels in your body, which decreases your  blood sugar (glucose). It also reduces the amount of sugar released into the blood and slows down your digestion. It can also be used to lower the risk of heart attack and stroke in people with type 2 diabetes. Changes to diet and exercise are often combined with this medication. ?This medicine may be used for other purposes; ask your health care provider or pharmacist if you have questions. ?COMMON BRAND NAME(S): OZEMPIC ?What should I tell my care team before I take this medication? ?They need to know if you have any of these conditions: ?Endocrine tumors (MEN 2) or if someone in your family had these tumors ?Eye disease, vision problems ?History of pancreatitis ?Kidney disease ?Stomach problems ?Thyroid cancer or if someone in your family had thyroid cancer ?An unusual or allergic reaction to semaglutide, other medications, foods, dyes, or preservatives ?Pregnant or trying to get pregnant ?Breast-feeding ?How should I use this medication? ?This medication is for injection under the skin of your upper leg (thigh), stomach area, or upper arm. It is given once every week (every 7 days). You will be taught how to prepare and give this medication. Use exactly as directed. Take your medication at regular intervals. Do not take it more often than directed. ?If you use this medication with insulin, you should inject this medication and the insulin separately. Do not mix them together. Do not give the injections right next to each other. Change (rotate) injection sites with each injection. ?It is important that you put your used needles and syringes in a special sharps container. Do not put them in a trash can. If you do not have a sharps container, call your pharmacist or care team to get one. ?A special MedGuide will be given to you by the pharmacist with each prescription and refill. Be sure to read this information carefully each time. ?This medication comes with INSTRUCTIONS FOR USE. Ask your pharmacist for  directions on how to use this medication. Read the information carefully. Talk to your pharmacist or care team if you have questions. ?Talk to your care team about the use of this medication in children. Special care may be needed. ?Overdosage: If you think you have taken too much of this medicine contact a poison control center or emergency room at once. ?NOTE: This medicine is only for you. Do not share this medicine with others. ?What if I miss a dose? ?If you miss a dose, take it as soon as you can within 5 days after the missed dose. Then take your next dose at your regular weekly time. If it has been longer than 5 days after the missed dose, do not take the missed dose. Take the next dose at your regular time. Do not take double or extra doses. If you have questions about a missed dose, contact your care team for advice. ?What may interact with this medication? ?Other medications for diabetes ?Many medications may cause changes in blood sugar, these include: ?Alcohol containing beverages ?Antiviral medications for HIV or AIDS ?Aspirin and aspirin-like medications ?Certain medications for blood pressure, heart disease, irregular heart  beat ?Chromium ?Diuretics ?Male hormones, such as estrogens or progestins, birth control pills ?Fenofibrate ?Gemfibrozil ?Isoniazid ?Lanreotide ?Male hormones or anabolic steroids ?MAOIs like Carbex, Eldepryl, Marplan, Nardil, and Parnate ?Medications for weight loss ?Medications for allergies, asthma, cold, or cough ?Medications for depression, anxiety, or psychotic disturbances ?Niacin ?Nicotine ?NSAIDs, medications for pain and inflammation, like ibuprofen or naproxen ?Octreotide ?Pasireotide ?Pentamidine ?Phenytoin ?Probenecid ?Quinolone antibiotics such as ciprofloxacin, levofloxacin, ofloxacin ?Some herbal dietary supplements ?Steroid medications such as prednisone or cortisone ?Sulfamethoxazole; trimethoprim ?Thyroid hormones ?Some medications can hide the warning  symptoms of low blood sugar (hypoglycemia). You may need to monitor your blood sugar more closely if you are taking one of these medications. These include: ?Beta-blockers, often used for high blood pressure or heart pr

## 2021-09-14 ENCOUNTER — Encounter: Payer: Self-pay | Admitting: Internal Medicine

## 2021-09-17 ENCOUNTER — Other Ambulatory Visit: Payer: Self-pay | Admitting: Internal Medicine

## 2021-09-17 ENCOUNTER — Telehealth: Payer: Self-pay | Admitting: Internal Medicine

## 2021-09-17 DIAGNOSIS — T148XXA Other injury of unspecified body region, initial encounter: Secondary | ICD-10-CM

## 2021-09-17 MED ORDER — MUPIROCIN 2 % EX OINT: 1.0000 "application " | TOPICAL_OINTMENT | Freq: Two times a day (BID) | CUTANEOUS | 0 refills | Status: DC

## 2021-09-17 NOTE — Telephone Encounter (Signed)
Sent bactroban now  ?Ozempic will need PA thank you  ?Inform pt ?

## 2021-09-17 NOTE — Telephone Encounter (Signed)
Pt called in stating that his pharmacy couldn't fill medication (Semaglutide,0.25 or 0.5MG /DOS, (OZEMPIC, 0.25 OR 0.5 MG/DOSE,) 2 MG/3ML SOPN)... Pt stated that his pharmacy advised him that they need to speak with provider to get PA on medication... Pt stated that Dr. French Ana was suppose to prescribe him some cream for a wound he has.... Pt stated that he never received the cream... Pt requesting callback ?

## 2021-09-17 NOTE — Telephone Encounter (Signed)
PA initiated for Ozempic (0.25 or 0.5 MG/DOSE) 2MG /3ML  ?Key: [id number] ?Outcome was favorable and is effective until  09/17/2021 through 09/16/2022. ?

## 2021-09-18 ENCOUNTER — Telehealth: Payer: Self-pay | Admitting: Internal Medicine

## 2021-09-18 NOTE — Telephone Encounter (Signed)
We discussed pulmonary manages cpap order is in for pulmonary referral  ?I have the order but if as discussed sleep study more than 5 years rec another sleep study and pulmonary take over the orders and manage his sleep apnea as I do not as PCP ? ?

## 2021-09-18 NOTE — Telephone Encounter (Signed)
Pt called in stating that he needed a script for setting for his cpap... Pt stated that that Dr. Olivia Mackie advised him that he will need to do another sleep study test... Pt stated that the DME/Sleep Study place advised him that he doesn't need another sleep study test they just need for the provider to sign off on the order for pt to receive the new machine with the correct setting... Pt requesting callback...  ?

## 2021-09-18 NOTE — Telephone Encounter (Signed)
I do not manage sleep apnea pulmonary does or one of the physicians at [facility]  ?And determine if he needs another sleep study  ?

## 2021-09-18 NOTE — Telephone Encounter (Signed)
Noted  

## 2021-09-24 ENCOUNTER — Other Ambulatory Visit: Payer: Self-pay | Admitting: Internal Medicine

## 2021-09-24 DIAGNOSIS — J309 Allergic rhinitis, unspecified: Secondary | ICD-10-CM

## 2021-10-08 ENCOUNTER — Telehealth: Payer: Self-pay

## 2021-10-08 NOTE — Telephone Encounter (Signed)
Prior Authorization for pt's Semaglutide,0.25 or 0.5MG /DOS, (OZEMPIC, 0.25 OR 0.5 MG/DOSE,) 2 MG/3ML SOPN was Approved via covermymeds.Effective from 09/17/21 to 09/16/22 ? ?Key: [id number] ?

## 2021-10-12 ENCOUNTER — Telehealth: Payer: Self-pay | Admitting: Internal Medicine

## 2021-10-12 NOTE — Telephone Encounter (Signed)
Pt called in stating he had severe back pain last night where he was dizzy, sweating and could barely walk. Pt stated he took tyneol last night and woke up this morning not feeling as bad. Sent to access nurse ?

## 2021-10-12 NOTE — Telephone Encounter (Signed)
Pt transferred to access nurse for further evaluation, and it was recommended for pt to go to ED for evaluation. Pt complied with plan and agreed to go to ED for further evaluation.  ?

## 2021-10-12 NOTE — Telephone Encounter (Signed)
Agree with Ed  

## 2021-10-12 NOTE — Telephone Encounter (Signed)
Please call and check pt went to the ED  ? ? ?

## 2021-10-12 NOTE — Telephone Encounter (Signed)
Noted advice as given rec Ed further care and based on sx's  ?Dr. French Ana McLean-Scocuzza ?

## 2021-10-12 NOTE — Telephone Encounter (Signed)
S/w pt and he stated he has not gone to ED because his pain is tolerable and not having any issues with walking able to mobilize. He stated that if sx's worsen he will go to ED for further evaluation tomorrow around lunchtime. Advised him he should be seen right away but denied stating he will wait it out and see how he is feeling.  ?

## 2021-10-13 ENCOUNTER — Encounter: Payer: Self-pay | Admitting: Intensive Care

## 2021-10-13 ENCOUNTER — Emergency Department
Admission: EM | Admit: 2021-10-13 | Discharge: 2021-10-13 | Disposition: A | Discharge status: Home / Self Care | Payer: BC Managed Care – PPO | Attending: Emergency Medicine | Admitting: Emergency Medicine

## 2021-10-13 ENCOUNTER — Other Ambulatory Visit: Payer: Self-pay

## 2021-10-13 DIAGNOSIS — M545 Low back pain, unspecified: Secondary | ICD-10-CM | POA: Diagnosis not present

## 2021-10-13 DIAGNOSIS — E119 Type 2 diabetes mellitus without complications: Secondary | ICD-10-CM | POA: Insufficient documentation

## 2021-10-13 DIAGNOSIS — Z8616 Personal history of COVID-19: Secondary | ICD-10-CM | POA: Insufficient documentation

## 2021-10-13 DIAGNOSIS — X501XXA Overexertion from prolonged static or awkward postures, initial encounter: Secondary | ICD-10-CM | POA: Diagnosis not present

## 2021-10-13 DIAGNOSIS — I10 Essential (primary) hypertension: Secondary | ICD-10-CM | POA: Insufficient documentation

## 2021-10-13 DIAGNOSIS — M5459 Other low back pain: Secondary | ICD-10-CM | POA: Diagnosis not present

## 2021-10-13 DIAGNOSIS — M549 Dorsalgia, unspecified: Secondary | ICD-10-CM | POA: Diagnosis not present

## 2021-10-13 HISTORY — DX: Type 2 diabetes mellitus without complications: E11.9

## 2021-10-13 MED ORDER — NAPROXEN 500 MG PO TABS
250.0000 mg | ORAL_TABLET | Freq: Once | ORAL | Status: AC
  Administered 2021-10-13: 250 mg via ORAL
  Filled 2021-10-13: qty 1

## 2021-10-13 MED ORDER — NAPROXEN 250 MG PO TABS: 250.0000 mg | ORAL_TABLET | Freq: Two times a day (BID) | ORAL | 0 refills | Status: DC

## 2021-10-13 NOTE — Discharge Instructions (Signed)
Please take the naproxen twice a day for your low back pain.  Please return to the emergency department if you are developing any difficulty urinating numbness or weakness in your legs.  Otherwise please follow-up with your primary care provider. ?

## 2021-10-13 NOTE — ED Triage Notes (Signed)
Patient reports Thursday night lower back pain started after laying on the couch. Denies injury ?

## 2021-10-13 NOTE — ED Provider Notes (Signed)
? ?[facility] ?Provider Note ? ? ? Event Date/Time  ? First MD Initiated Contact with Patient 10/13/21 1613   ?  (approximate) ? ? ?History  ? ?Back Pain ? ? ?HPI ? ?John Orozco is a 61 y.o. male with past medical history of diabetes hypertension hyperlipidemia and obesity presents with back pain.  Patient open the hood of his car on Thursday initially felt a twinge in his back but did not have significant pain.  He was sitting on the couch after work when he developed significant pain in the low back caused him to be sweaty.  It felt improved after he laid down.  He took a dose of his wife's meloxicam which significantly improved the pain.  Was initially doing well is able to walk until today after he was on the lawnmower cutting his grass when he got off he had recurrence of the severe pain.  Pain is in the lower back does not radiate he denies any numbness tingling weakness in his legs.  No saddle anesthesia bowel or bladder incontinence fevers.  No history of similar back pain.  The pain is worse with sitting and improves with lying down.  He denies abdominal pain. ? ?Past Medical History:  ?Diagnosis Date  ? Arthritis   ? COVID-19   ? 12/2020  ? Diabetes mellitus without complication (HCC)   ? Essential hypertension   ? Hyperlipidemia   ? Left knee pain   ? Lymphedema   ? Morbid obesity with BMI of 50.0-59.9, adult (HCC)   ? OSA on CPAP   ? ? ?Patient Active Problem List  ? Diagnosis Date Noted  ? Hypertension associated with diabetes (HCC) 09/13/2021  ? Vitamin D deficiency 11/13/2018  ? Allergic rhinitis 04/17/2018  ? Left knee pain 04/17/2018  ? Prediabetes 04/17/2018  ? Hyperlipidemia 04/17/2018  ? Lymphedema 07/15/2017  ? Encounter for health maintenance examination with abnormal findings 09/05/2016  ? Essential hypertension   ? OSA on CPAP   ? Morbid obesity with BMI of 50.0-59.9, adult (HCC)   ? ? ? ?Physical Exam  ?Triage Vital Signs: ?ED Triage Vitals  ?Enc Vitals Group  ?   BP  10/13/21 1512 123/63  ?   Pulse Rate 10/13/21 1512 76  ?   Resp 10/13/21 1512 16  ?   Temp 10/13/21 1512 97.7 ?F (36.5 ?C)  ?   Temp Source 10/13/21 1512 Oral  ?   SpO2 10/13/21 1512 96 %  ?   Weight 10/13/21 1513 (!) 350 lb (158.8 kg)  ?   Height 10/13/21 1513 5\' 10"  (1.778 m)  ?   Head Circumference --   ?   Peak Flow --   ?   Pain Score 10/13/21 1513 8  ?   Pain Loc --   ?   Pain Edu? --   ?   Excl. in GC? --   ? ? ?Most recent vital signs: ?Vitals:  ? 10/13/21 1512  ?BP: 123/63  ?Pulse: 76  ?Resp: 16  ?Temp: 97.7 ?F (36.5 ?C)  ?SpO2: 96%  ? ? ? ?General: Awake, no distress.  ?CV:  Good peripheral perfusion.  ?Resp:  Normal effort.  ?Abd:  No distention.  ?Neuro:             Awake, Alert, Oriented x 3  ?Other:  No midline thoracic or lumbar tenderness to palpation, no significant muscular tenderness to palpation ? ?5/5 strength with hip flexion, plantar flexion and dorsiflexion  ? ? ?  ED Results / Procedures / Treatments  ?Labs ?(all labs ordered are listed, but only abnormal results are displayed) ?Labs Reviewed - No data to display ? ? ?EKG ? ? ? ? ?RADIOLOGY ? ? ? ?PROCEDURES: ? ?Critical Care performed: No ? ?Procedures ? ? ? ?MEDICATIONS ORDERED IN ED: ?Medications  ?naproxen (NAPROSYN) tablet 250 mg (has no administration in time range)  ? ? ? ?IMPRESSION / MDM / ASSESSMENT AND PLAN / ED COURSE  ?I reviewed the triage vital signs and the nursing notes. ?             ?               ? ?Differential diagnosis includes, but is not limited to, low back strain, DJD, herniated disc, less likely discitis/osteomyelitis, epidural abscess, cauda equina syndrome, AAA ? ?Patient is a 61 year old male presents with low back pain.  His symptoms seem to have been triggered by opening his trunk of his car.  His pain is midline in the lumbar region he has no radiation no sciatica no numbness tingling weakness.  No red flags for cauda equina syndrome including no bowel bladder incontinence.  Pain was relieved by NSAIDs but  seem to get worse today after he cut the lawn with sitting on his lawnmower.  Pain is worse with sitting improved with laying down he has no abdominal pain.  On exam he has no midline lumbar tenderness.  Pain is worse when he sits up in bed clearly positional.  He has full strength and sensation in his lower extremities.  Overall I suspect musculoskeletal cause of back pain.  Considered more concerning pathology including osteomyelitis/discitis/occult fracture but patient has no risk factors, no systemic symptoms and is otherwise well.  Recommended NSAIDs.  Will prescribe prescription for naproxen.  We discussed return for red flag symptoms of cauda equina syndrome as well as PCP follow-up if pain is not improving. ? ?  ? ? ?FINAL CLINICAL IMPRESSION(S) / ED DIAGNOSES  ? ?Final diagnoses:  ?Acute midline low back pain without sciatica  ? ? ? ?Rx / DC Orders  ? ?ED Discharge Orders   ? ?      Ordered  ?  naproxen (NAPROSYN) 250 MG tablet  2 times daily with meals       ? 10/13/21 1643  ? ?  ?  ? ?  ? ? ? ?Note:  This document was prepared using Dragon voice recognition software and may include unintentional dictation errors. ?  ?Georga Hacking, MD ?10/13/21 1651 ? ?

## 2021-10-14 ENCOUNTER — Emergency Department: Payer: BC Managed Care – PPO

## 2021-10-14 ENCOUNTER — Emergency Department
Admission: EM | Admit: 2021-10-14 | Discharge: 2021-10-14 | Disposition: A | Discharge status: Home / Self Care | Payer: BC Managed Care – PPO | Attending: Emergency Medicine | Admitting: Emergency Medicine

## 2021-10-14 ENCOUNTER — Other Ambulatory Visit: Payer: Self-pay

## 2021-10-14 ENCOUNTER — Encounter: Payer: Self-pay | Admitting: Emergency Medicine

## 2021-10-14 DIAGNOSIS — M5442 Lumbago with sciatica, left side: Secondary | ICD-10-CM | POA: Diagnosis not present

## 2021-10-14 DIAGNOSIS — M5441 Lumbago with sciatica, right side: Secondary | ICD-10-CM

## 2021-10-14 DIAGNOSIS — M48062 Spinal stenosis, lumbar region with neurogenic claudication: Secondary | ICD-10-CM | POA: Insufficient documentation

## 2021-10-14 DIAGNOSIS — M48061 Spinal stenosis, lumbar region without neurogenic claudication: Secondary | ICD-10-CM | POA: Diagnosis not present

## 2021-10-14 DIAGNOSIS — M545 Low back pain, unspecified: Secondary | ICD-10-CM | POA: Diagnosis not present

## 2021-10-14 DIAGNOSIS — M4316 Spondylolisthesis, lumbar region: Secondary | ICD-10-CM | POA: Diagnosis not present

## 2021-10-14 MED ORDER — ACETAMINOPHEN 500 MG PO TABS
1000.0000 mg | ORAL_TABLET | Freq: Once | ORAL | Status: AC
Start: 2021-10-14 — End: 2021-10-14
  Administered 2021-10-14: 1000 mg via ORAL
  Filled 2021-10-14: qty 2

## 2021-10-14 MED ORDER — METHOCARBAMOL 500 MG PO TABS
500.0000 mg | ORAL_TABLET | Freq: Three times a day (TID) | ORAL | 0 refills | Status: DC | PRN
Start: 2021-10-14 — End: 2021-10-24

## 2021-10-14 MED ORDER — LIDOCAINE 5 % EX PTCH
1.0000 | MEDICATED_PATCH | CUTANEOUS | Status: DC
  Administered 2021-10-14: 1 via TRANSDERMAL
  Filled 2021-10-14: qty 1

## 2021-10-14 MED ORDER — LIDOCAINE 5 % EX PTCH: 1.0000 | MEDICATED_PATCH | Freq: Two times a day (BID) | CUTANEOUS | 0 refills | Status: DC

## 2021-10-14 MED ORDER — MELOXICAM 15 MG PO TABS: 15.0000 mg | ORAL_TABLET | Freq: Every day | ORAL | 0 refills | Status: DC

## 2021-10-14 MED ORDER — METHOCARBAMOL 500 MG PO TABS
500.0000 mg | ORAL_TABLET | Freq: Once | ORAL | Status: AC
  Administered 2021-10-14: 500 mg via ORAL
  Filled 2021-10-14 (×2): qty 1

## 2021-10-14 MED ORDER — HYDROMORPHONE HCL 1 MG/ML IJ SOLN
1.0000 mg | Freq: Once | INTRAMUSCULAR | Status: AC
  Administered 2021-10-14: 1 mg via INTRAMUSCULAR
  Filled 2021-10-14: qty 1

## 2021-10-14 MED ORDER — KETOROLAC TROMETHAMINE 30 MG/ML IJ SOLN
30.0000 mg | Freq: Once | INTRAMUSCULAR | Status: AC
  Administered 2021-10-14: 30 mg via INTRAMUSCULAR
  Filled 2021-10-14: qty 1

## 2021-10-14 NOTE — ED Triage Notes (Signed)
Pt reports low back pain since Thursday. Pt states he was sitting there watching TV when it started. Pt describes the pain as constant and sharp with movement. Pt denies obvious injuries or loss of bowel and bladder. Pt reports was seen here yesterday, given some meds and advised to come back today if no better for MRI. Pt states he is much worse today that he was yesterday.  ?

## 2021-10-14 NOTE — ED Notes (Signed)
Patient states he was able to transfer from wheelchair to stretcher independently. ?

## 2021-10-14 NOTE — Discharge Instructions (Addendum)
Use Tylenol for pain and fevers.  Up to 1000 mg per dose, up to 4 times per day.  Do not take more than 4000 mg of Tylenol/acetaminophen within 24 hours.. ? ?Meloxicam NSAID once daily. Do NOT take Ibuprofen or Naproxen while taking this medication ? ?Robaxin/methocarbamol muscle relaxer 3-4 times per day.  ? ?Please use lidocaine patches at your site of pain.  Apply 1 patch at a time, leave on for 12 hours, then remove for 12 hours.  12 hours on, 12 hours off.  Do not apply more than 1 patch at a time. ? ?

## 2021-10-14 NOTE — ED Provider Notes (Signed)
? ?[facility] ?Provider Note ? ? ? Event Date/Time  ? First MD Initiated Contact with Patient 10/14/21 1052   ?  (approximate) ? ? ?History  ? ?Back Pain ? ? ?HPI ? ?John Orozco is a 61 y.o. male who presents to the ED for evaluation of Back Pain ?  ?I review ED visit from yesterday where patient was evaluated for acute atraumatic lumbar back pain, discharged with NSAIDs after improvement.  Returns to the ED, accompanied by his wife, for evaluation of worsening pain despite these medications.  He reports inability to walk due to the severity of the pain.  Denies any falls, injuries.  Denies fever, saddle anesthesias, urinary or stool retention. ? ? ?Physical Exam  ? ?Triage Vital Signs: ?ED Triage Vitals  ?Enc Vitals Group  ?   BP 10/14/21 1041 116/67  ?   Pulse Rate 10/14/21 1041 70  ?   Resp 10/14/21 1041 18  ?   Temp 10/14/21 1041 97.8 ?F (36.6 ?C)  ?   Temp Source 10/14/21 1041 Oral  ?   SpO2 10/14/21 1041 96 %  ?   Weight 10/14/21 1040 (!) 350 lb (158.8 kg)  ?   Height 10/14/21 1040 5\' 10"  (1.778 m)  ?   Head Circumference --   ?   Peak Flow --   ?   Pain Score 10/14/21 1040 10  ?   Pain Loc --   ?   Pain Edu? --   ?   Excl. in GC? --   ? ? ?Most recent vital signs: ?Vitals:  ? 10/14/21 1041 10/14/21 1410  ?BP: 116/67 116/66  ?Pulse: 70 61  ?Resp: 18 18  ?Temp: 97.8 ?F (36.6 ?C)   ?SpO2: 96% 96%  ? ? ?General: Awake, no distress.  Morbidly obese. ?CV:  Good peripheral perfusion.  ?Resp:  Normal effort.  ?Abd:  No distention.  ?MSK:  No deformity noted.  With localizing and diffuse lumbar tenderness to palpation without overlying skin changes, signs of trauma, spinal step-offs or focal midline tenderness. ?Neuro:  No focal deficits appreciated. Cranial nerves II through XII intact ?5/5 strength and sensation in all 4 extremities ?Other:   ? ? ?ED Results / Procedures / Treatments  ? ?Labs ?(all labs ordered are listed, but only abnormal results are displayed) ?Labs Reviewed - No data to  display ? ?EKG ? ? ?RADIOLOGY ? ? ?Official radiology report(s): ?MR LUMBAR SPINE WO CONTRAST ? ?Result Date: 10/14/2021 ?CLINICAL DATA:  Uncontrolled low back pain. EXAM: MRI LUMBAR SPINE WITHOUT CONTRAST TECHNIQUE: Multiplanar, multisequence MR imaging of the lumbar spine was performed. No intravenous contrast was administered. COMPARISON:  None Available. FINDINGS: Segmentation:  Standard. Alignment: 4 mm retrolisthesis of L3 on L4. Mild straightening of the normal lumbar lordosis. Vertebrae: No fracture, suspicious marrow lesion, or significant marrow edema. Conus medullaris and cauda equina: Conus extends to the L2 level. Conus and cauda equina appear normal. Paraspinal and other soft tissues: Unremarkable. Disc levels: Disc desiccation throughout the lumbar spine with moderate disc space narrowing at L2-3 and L3-4. Diffuse congenital narrowing of the lumbar spinal canal due to short pedicles. T12-L1: Negative. L1-2: Negative. L2-3: Disc bulging and prominent epidural fat result in mild spinal stenosis without neural foraminal stenosis. L3-4: Circumferential disc bulging eccentric to the left, endplate spurring, and mild facet hypertrophy result in mild left lateral recess stenosis and mild-to-moderate left greater than right neural foraminal stenosis. Potential left L3 nerve root impingement. L4-5: Circumferential disc  bulging, endplate spurring, and mild facet hypertrophy result in mild-to-moderate bilateral neural foraminal stenosis without spinal stenosis. L5-S1: Disc bulging and mild facet hypertrophy result in mild right and mild-to-moderate left neural foraminal stenosis. There is a shallow right paracentral to subarticular disc protrusion without significant spinal or lateral recess stenosis. IMPRESSION: Congenitally short pedicles with superimposed disc and facet degeneration resulting in mild spinal stenosis at L2-3 and mild-to-moderate neural foraminal stenosis at L3-4, L4-5, and L5-S1. Electronically  Signed   By: Sebastian Ache M.D.   On: 10/14/2021 14:29   ? ?PROCEDURES and INTERVENTIONS: ? ?Procedures ? ?Medications  ?lidocaine (LIDODERM) 5 % 1 patch (1 patch Transdermal Patch Applied 10/14/21 1122)  ?acetaminophen (TYLENOL) tablet 1,000 mg (1,000 mg Oral Given 10/14/21 1121)  ?ketorolac (TORADOL) 30 MG/ML injection 30 mg (30 mg Intramuscular Given 10/14/21 1121)  ?methocarbamol (ROBAXIN) tablet 500 mg (500 mg Oral Given 10/14/21 1122)  ?HYDROmorphone (DILAUDID) injection 1 mg (1 mg Intramuscular Given 10/14/21 1301)  ? ? ? ?IMPRESSION / MDM / ASSESSMENT AND PLAN / ED COURSE  ?I reviewed the triage vital signs and the nursing notes. ? ?Morbidly obese 61 year old male presents to the ED with atraumatic back pain with evidence of spinal stenosis and muscular strain/spasm suitable for outpatient management with neurosurgical and PCP follow-up.  He presents with continued atraumatic pain without red flag features and I attempted nonnarcotic multimodal analgesia, but despite this he reports feeling "worse" and after shared decision making we pursue MRI of his lumbar spine.  No signs of cauda equina syndrome, cord compression or fracture.  Mild spinal stenosis is noted.  Eventually has improving symptoms and is ambulatory and suitable for outpatient management.  We will discharge with return precautions, referral to neurosurgery and recommendations to follow-up with his PCP. ? ?Clinical Course as of 10/14/21 1449  ?Sun Oct 14, 2021  ?1447 Reassess after MRI.  We discussed spinal stenosis.  We discussed weight loss, nonnarcotic multimodal analgesia, following with neurosurgery, PCP and return precautions for the ED.  Answered questions.  He is up and ambulatory with a slow gait [DS]  ?  ?Clinical Course User Index ?[DS] Delton Prairie, MD  ? ? ? ?FINAL CLINICAL IMPRESSION(S) / ED DIAGNOSES  ? ?Final diagnoses:  ?Acute bilateral low back pain with bilateral sciatica  ?Spinal stenosis of lumbar region, unspecified whether  neurogenic claudication present  ? ? ? ?Rx / DC Orders  ? ?ED Discharge Orders   ? ?      Ordered  ?  lidocaine (LIDODERM) 5 %  Every 12 hours       ? 10/14/21 1444  ?  methocarbamol (ROBAXIN) 500 MG tablet  Every 8 hours PRN       ? 10/14/21 1444  ?  meloxicam (MOBIC) 15 MG tablet  Daily       ? 10/14/21 1444  ? ?  ?  ? ?  ? ? ? ?Note:  This document was prepared using Dragon voice recognition software and may include unintentional dictation errors. ?  ?Delton Prairie, MD ?10/14/21 1450 ? ?

## 2021-10-15 ENCOUNTER — Telehealth: Payer: Self-pay | Admitting: Internal Medicine

## 2021-10-15 NOTE — Telephone Encounter (Signed)
Patient went to the hospital ,at [facility] over weekend. He is complaining of back pain. They gave him some medication, but it is not working. He is requesting something stronger. Patient has been scheduled to see his provider tomorrow, 10/16/2021. ?

## 2021-10-16 ENCOUNTER — Encounter: Payer: Self-pay | Admitting: Internal Medicine

## 2021-10-16 ENCOUNTER — Ambulatory Visit (INDEPENDENT_AMBULATORY_CARE_PROVIDER_SITE_OTHER): Payer: BC Managed Care – PPO | Admitting: Internal Medicine

## 2021-10-16 ENCOUNTER — Telehealth: Payer: Self-pay | Admitting: Internal Medicine

## 2021-10-16 VITALS — BP 130/82 | HR 79 | Temp 98.3°F | Resp 14 | Ht 70.0 in | Wt 359.2 lb

## 2021-10-16 DIAGNOSIS — I152 Hypertension secondary to endocrine disorders: Secondary | ICD-10-CM

## 2021-10-16 DIAGNOSIS — M4807 Spinal stenosis, lumbosacral region: Secondary | ICD-10-CM

## 2021-10-16 DIAGNOSIS — E1159 Type 2 diabetes mellitus with other circulatory complications: Secondary | ICD-10-CM

## 2021-10-16 DIAGNOSIS — I10 Essential (primary) hypertension: Secondary | ICD-10-CM

## 2021-10-16 DIAGNOSIS — M5136 Other intervertebral disc degeneration, lumbar region: Secondary | ICD-10-CM

## 2021-10-16 DIAGNOSIS — M47816 Spondylosis without myelopathy or radiculopathy, lumbar region: Secondary | ICD-10-CM

## 2021-10-16 MED ORDER — METHYLPREDNISOLONE 4 MG PO TBPK: ORAL_TABLET | ORAL | 0 refills | Status: DC

## 2021-10-16 MED ORDER — OZEMPIC (0.25 OR 0.5 MG/DOSE) 2 MG/3ML ~~LOC~~ SOPN: 0.5000 mg | PEN_INJECTOR | SUBCUTANEOUS | 2 refills | Status: DC

## 2021-10-16 MED ORDER — OXYCODONE-ACETAMINOPHEN 10-325 MG PO TABS: 1.0000 | ORAL_TABLET | Freq: Two times a day (BID) | ORAL | 0 refills | Status: AC | PRN

## 2021-10-16 MED ORDER — PEN NEEDLES 30G X 8 MM MISC: 1.0000 | 3 refills | Status: AC

## 2021-10-16 NOTE — Progress Notes (Signed)
Chief Complaint  ?Patient presents with  ? Hospitalization Follow-up  ?  Pt c/o back pain, was seen at [facility] ED 5/14 for same issue. MRI was performed and showed lumbar spinal stenosis. Was given patches,mobic,&robaxin w/minor relief. Requesting something stronger.   ? ?F/U WITH WIFE ?Ed visit 5/13 low back pain and 5/14 [facility] low back pain given toradol 30, diluadid 1 mg , robaxin, mobic, lidocaine 4% and biofreeze, which is helping but not completely pain is 7/10 linear band low back no numbness/tingling lower back and started Thursday pm 9:30 pm had severe pain was sweating and dizzy and unable to walk/stand then sat again riding lawnmower and pm back pain worse. Lying flat back pain better and also bending over  ? ? ?Review of Systems  ?Constitutional:  Negative for weight loss.  ?HENT:  Negative for hearing loss.   ?Eyes:  Negative for blurred vision.  ?Respiratory:  Negative for shortness of breath.   ?Cardiovascular:  Negative for chest pain.  ?Gastrointestinal:  Negative for abdominal pain and blood in stool.  ?Musculoskeletal:  Positive for back pain.  ?Skin:  Negative for rash.  ?Neurological:  Negative for headaches.  ?Psychiatric/Behavioral:  Negative for depression.   ?Past Medical History:  ?Diagnosis Date  ? Arthritis   ? COVID-19   ? 12/2020  ? Diabetes mellitus without complication ([place])   ? Essential hypertension   ? Hyperlipidemia   ? Left knee pain   ? Lymphedema   ? Morbid obesity with BMI of 50.0-59.9, adult ([place])   ? OSA on CPAP   ? ?Past Surgical History:  ?Procedure Laterality Date  ? knee surgery    ? left 61 y.o   ? UMBILICAL HERNIA REPAIR    ? ?Family History  ?Problem Relation Age of Onset  ? Hypertension Mother   ? Heart disease Mother   ? ?Social History  ? ?Socioeconomic History  ? Marital status: Married  ?  Spouse name: Not on file  ? Number of children: Not on file  ? Years of education: Not on file  ? Highest education level: Not on file  ?Occupational History  ? Not on file   ?Tobacco Use  ? Smoking status: Never  ? Smokeless tobacco: Never  ?Substance and Sexual Activity  ? Alcohol use: Yes  ?  Alcohol/week: 1.0 standard drink  ?  Types: 1 Cans of beer per week  ?  Comment: rare  ? Drug use: No  ? Sexual activity: Yes  ?  Partners: Female  ?Other Topics Concern  ? Not on file  ?Social History Narrative  ? Married   ? Kids (daughter)   ? grandson  ? Dump Truck driver -Engineer, materials  ?   ? ?Social Determinants of Health  ? ?Financial Resource Strain: Not on file  ?Food Insecurity: Not on file  ?Transportation Needs: Not on file  ?Physical Activity: Not on file  ?Stress: Not on file  ?Social Connections: Not on file  ?Intimate Partner Violence: Not on file  ? ?Current Meds  ?Medication Sig  ? aspirin 81 MG chewable tablet Chew 81 mg by mouth daily.  ? blood glucose meter kit and supplies Dispense based on patient and insurance preference. Use up to four times daily as directed. (FOR ICD-10 E10.9, E11.9). 1 year lancets and strips  ? EQ ALLERGY RELIEF 10 MG tablet TAKE 1 TABLET BY MOUTH ONCE DAILY AS NEEDED FOR ALLERGIES  ? Insulin Pen Needle (PEN NEEDLES) 30G X 8 MM MISC 1  Device by Does not apply route once a week. Pen needles for ozempic  ? lidocaine (LIDODERM) 5 % Place 1 patch onto the skin every 12 (twelve) hours. Remove & Discard patch within 12 hours or as directed by MD  ? lisinopril (ZESTRIL) 30 MG tablet Take 1 tablet (30 mg total) by mouth daily.  ? meloxicam (MOBIC) 15 MG tablet Take 1 tablet (15 mg total) by mouth daily.  ? methocarbamol (ROBAXIN) 500 MG tablet Take 1 tablet (500 mg total) by mouth every 8 (eight) hours as needed for muscle spasms.  ? methylPREDNISolone (MEDROL DOSEPAK) 4 MG TBPK tablet Use as directed in the am  ? mupirocin ointment (BACTROBAN) 2 % Apply 1 application. topically 2 (two) times daily. Right leg wound x 7-14 days  ? oxyCODONE-acetaminophen (PERCOCET) 10-325 MG tablet Take 1 tablet by mouth 2 (two) times daily as needed for up to 5 days for pain.  ?  [DISCONTINUED] Semaglutide,0.25 or 0.5MG/DOS, (OZEMPIC, 0.25 OR 0.5 MG/DOSE,) 2 MG/3ML SOPN Inject 0.25 mg into the skin once a week. X 1 month increase dose to 0.5 weekly after 1 month if tolerating  ? ?No Known Allergies ?Recent Results (from the past 2160 hour(s))  ?Comprehensive metabolic panel     Status: Abnormal  ? Collection Time: 09/10/21  7:36 AM  ?Result Value Ref Range  ? Sodium 138 135 - 145 mEq/L  ? Potassium 4.4 3.5 - 5.1 mEq/L  ? Chloride 103 96 - 112 mEq/L  ? CO2 25 19 - 32 mEq/L  ? Glucose, Bld 157 (H) 70 - 99 mg/dL  ? BUN 18 6 - 23 mg/dL  ? Creatinine, Ser 0.92 0.40 - 1.50 mg/dL  ? Total Bilirubin 0.5 0.2 - 1.2 mg/dL  ? Alkaline Phosphatase 38 (L) 39 - 117 U/L  ? AST 26 0 - 37 U/L  ? ALT 50 0 - 53 U/L  ? Total Protein 6.4 6.0 - 8.3 g/dL  ? Albumin 4.2 3.5 - 5.2 g/dL  ? GFR 90.18 >60.00 mL/min  ?  Comment: Calculated using the CKD-EPI Creatinine Equation (2021)  ? Calcium 9.4 8.4 - 10.5 mg/dL  ?VITAMIN D 25 Hydroxy (Vit-D Deficiency, Fractures)     Status: None  ? Collection Time: 09/10/21  7:36 AM  ?Result Value Ref Range  ? VITD 47.81 30.00 - 100.00 ng/mL  ?PSA     Status: None  ? Collection Time: 09/10/21  7:36 AM  ?Result Value Ref Range  ? PSA 0.82 0.10 - 4.00 ng/mL  ?  Comment: Test performed using Access Hybritech PSA Assay, a parmagnetic partical, chemiluminecent immunoassay.  ?Hemoglobin A1c     Status: Abnormal  ? Collection Time: 09/10/21  7:36 AM  ?Result Value Ref Range  ? Hgb A1c MFr Bld 7.3 (H) 4.6 - 6.5 %  ?  Comment: Glycemic Control Guidelines for People with Diabetes:Non Diabetic:  <6%Goal of Therapy: <7%Additional Action Suggested:  >8%   ?CBC with Differential/Platelet     Status: None  ? Collection Time: 09/10/21  7:36 AM  ?Result Value Ref Range  ? WBC 7.6 4.0 - 10.5 K/uL  ? RBC 4.92 4.22 - 5.81 Mil/uL  ? Hemoglobin 15.6 13.0 - 17.0 g/dL  ? HCT 45.6 39.0 - 52.0 %  ? MCV 92.6 78.0 - 100.0 fl  ? MCHC 34.3 30.0 - 36.0 g/dL  ? RDW 13.5 11.5 - 15.5 %  ? Platelets 272.0 150.0 -  400.0 K/uL  ? Neutrophils Relative % 64.3 43.0 - 77.0 %  ?  Lymphocytes Relative 22.9 12.0 - 46.0 %  ? Monocytes Relative 8.3 3.0 - 12.0 %  ? Eosinophils Relative 3.8 0.0 - 5.0 %  ? Basophils Relative 0.7 0.0 - 3.0 %  ? Neutro Abs 4.9 1.4 - 7.7 K/uL  ? Lymphs Abs 1.7 0.7 - 4.0 K/uL  ? Monocytes Absolute 0.6 0.1 - 1.0 K/uL  ? Eosinophils Absolute 0.3 0.0 - 0.7 K/uL  ? Basophils Absolute 0.1 0.0 - 0.1 K/uL  ?Lipid panel     Status: Abnormal  ? Collection Time: 09/10/21  7:36 AM  ?Result Value Ref Range  ? Cholesterol 163 0 - 200 mg/dL  ?  Comment: ATP III Classification       Desirable:  < 200 mg/dL               Borderline High:  200 - 239 mg/dL          High:  > = 240 mg/dL  ? Triglycerides 133.0 0.0 - 149.0 mg/dL  ?  Comment: Normal:  <150 mg/dLBorderline High:  150 - 199 mg/dL  ? HDL 37.90 (L) >39.00 mg/dL  ? VLDL 26.6 0.0 - 40.0 mg/dL  ? LDL Cholesterol 98 0 - 99 mg/dL  ? Total CHOL/HDL Ratio 4   ?  Comment:                Men          Women1/2 Average Risk     3.4          3.3Average Risk          5.0          4.42X Average Risk          9.6          7.13X Average Risk          15.0          11.0                      ? NonHDL 125.08   ?  Comment: NOTE:  Non-HDL goal should be 30 mg/dL higher than patient's LDL goal (i.e. LDL goal of < 70 mg/dL, would have non-HDL goal of < 100 mg/dL)  ?Urinalysis, Routine w reflex microscopic     Status: None  ? Collection Time: 09/10/21  7:36 AM  ?Result Value Ref Range  ? Color, Urine YELLOW Yellow;Lt. Yellow;Straw;Dark Yellow;Amber;Green;Red;Brown  ? APPearance CLEAR Clear;Turbid;Slightly Cloudy;Cloudy  ? Specific Gravity, Urine 1.025 1.000 - 1.030  ? pH 5.5 5.0 - 8.0  ? Total Protein, Urine NEGATIVE Negative  ? Urine Glucose NEGATIVE Negative  ? Ketones, ur NEGATIVE Negative  ? Bilirubin Urine NEGATIVE Negative  ? Hgb urine dipstick NEGATIVE Negative  ? Urobilinogen, UA 0.2 0.0 - 1.0  ? Leukocytes,Ua NEGATIVE Negative  ? Nitrite NEGATIVE Negative  ? WBC, UA 0-2/hpf 0-2/hpf  ?  RBC / HPF none seen 0-2/hpf  ? Squamous Epithelial / LPF Rare(0-4/hpf) Rare(0-4/hpf)  ?TSH     Status: None  ? Collection Time: 09/10/21  7:36 AM  ?Result Value Ref Range  ? TSH 2.98 0.35 - 5.50 uIU/mL  ? ?Objecti

## 2021-10-16 NOTE — Telephone Encounter (Signed)
Dr.Tracy made aware. She will place referral to [facility] and referral was placed for [facility]  ?

## 2021-10-16 NOTE — Addendum Note (Signed)
Addended by: Quentin Ore on: 10/16/2021 04:49 PM ? ? Modules accepted: Orders ? ?

## 2021-10-16 NOTE — Telephone Encounter (Signed)
Pt spouse called stating that provider gave pt referrals for places to go and they chose [facility] ?

## 2021-10-16 NOTE — Patient Instructions (Addendum)
Preferred physical therapy place with your insurance in [place] ? ? ?[facility] ?Phone Fax E-mail Address  ?[phone] Not available [address]  ? [place] [address]  ?   ?Specialties     ? ? ?Epidural Steroid Injection ? ?An epidural steroid injection is a shot of steroid medicine and numbing medicine that is given into the space between the spinal cord and the bones of the back (epidural space). The shot helps relieve pain caused by an irritated or swollen nerve root. ?The amount of pain relief you get from the injection depends on what is causing the nerve to be swollen and irritated, and how long your pain lasts. You are more likely to benefit from this injection if your pain is strong and comes on suddenly rather than if you have had long-term (chronic) pain. ?Tell a health care provider about: ?Any allergies you have. ?All medicines you are taking, including vitamins, herbs, eye drops, creams, and over-the-counter medicines. ?Any problems you or family members have had with anesthetic medicines. ?Any blood disorders you have. ?Any surgeries you have had. ?Any medical conditions you have. ?Whether you are pregnant or may be pregnant. ?What are the risks? ?Generally, this is a safe procedure. However, problems may occur, including: ?Headache. ?Bleeding. ?Infection. ?Allergic reaction to medicines. ?Nerve damage. ?What happens before the procedure? ?Staying hydrated ?Follow instructions from your health care provider about hydration, which may include: ?Up to 2 hours before the procedure - you may continue to drink clear liquids, such as water, clear fruit juice, black coffee, and plain tea. ?Eating and drinking restrictions ?Follow instructions from your health care provider about eating and drinking, which may include: ?8 hours before the procedure - stop eating heavy meals or foods, such as meat, fried foods, or fatty foods. ?6 hours before the procedure - stop eating light meals  or foods, such as toast or cereal. ?6 hours before the procedure - stop drinking milk or drinks that contain milk. ?2 hours before the procedure - stop drinking clear liquids. ?Medicines ?You may be given medicines to lower anxiety. ?Ask your health care provider about: ?Changing or stopping your regular medicines. This is especially important if you are taking diabetes medicines or blood thinners. ?Taking medicines such as aspirin and ibuprofen. These medicines can thin your blood. Do not take these medicines unless your health care provider tells you to take them. ?Taking over-the-counter medicines, vitamins, herbs, and supplements. ?General instructions ?Ask your health care provider what steps will be taken to prevent infection. ?Plan to have a responsible adult take you home from the hospital or clinic. ?If you will be going home right after the procedure, plan to have a responsible adult care for you for the time you are told. This is important. ?What happens during the procedure? ?An IV will be inserted into one of your veins. ?You will be given one or more of the following: ?A medicine to help you relax (sedative). ?A medicine to numb the area (local anesthetic). ?You will be asked to lie on your abdomen or sit. ?The injection site will be cleaned. ?A needle will be inserted through your skin into the epidural space. This may cause you some discomfort. An X-ray machine will be used to guide the needle as close as possible to the affected nerve. ?A steroid medicine and a local anesthetic will be injected into the epidural space. ?The needle and IV will be removed. ?A bandage (dressing) will be put over the  injection site. ?The procedure may vary among health care providers and hospitals. ?What can I expect after the procedure? ?Your blood pressure, heart rate, breathing rate, and blood oxygen level will be monitored until you leave the hospital or clinic. ?Your arm or leg may feel weak or numb for a few  hours. ?The injection site may feel sore. ?Follow these instructions at home: ?Injection site care ?You may remove the bandage (dressing) after 24 hours. ?Check your injection site every day for signs of infection. Check for: ?Redness, swelling, or pain. ?Fluid or blood. ?Warmth. ?Pus or a bad smell. ?Managing pain, stiffness, and swelling ?For 24 hours after the procedure: ?Avoid using heat on the injection site. ?Do not take baths, swim, or use a hot tub until your health care provider approves. Ask your health care provider if you may take showers. You may only be allowed to take sponge baths. ?If directed, put ice on the injection site. To do this: ?Put ice in a plastic bag. ?Place a towel between your skin and the bag. ?Leave the ice on for 20 minutes, 2-3 times a day. ? ?Activity ?If you were given a sedative during the procedure, it can affect you for several hours. Do not drive or operate machinery until your health care provider says that it is safe. ?Return to your normal activities as told by your health care provider. Ask your health care provider what activities are safe for you. ?General instructions ?Take over-the-counter and prescription medicines only as told by your health care provider. ?Drink enough fluid to keep your urine pale yellow. ?Keep all follow-up visits as told by your health care provider. This is important. ?Contact a health care provider if: ?You have any of these signs of infection: ?Redness, swelling, or pain around your injection site. ?Fluid or blood coming from your injection site. ?Warmth coming from your injection site. ?Pus or a bad smell coming from your injection site. ?A fever. ?You continue to have pain and soreness around the injection site, even after taking over-the-counter pain medicine. ?You have severe, sudden, or lasting nausea or vomiting. ?Get help right away if: ?You have severe pain at the injection site that is not relieved by medicines. ?You develop a severe  headache or a stiff neck. ?You become sensitive to light. ?You have any new numbness or weakness in your legs or arms. ?You lose control of your bladder or bowel movements. ?You have trouble breathing. ?Summary ?An epidural steroid injection is a shot of steroid medicine and numbing medicine that is given into the epidural space. ?The shot helps relieve pain caused by an irritated or swollen nerve root. ?You are more likely to benefit from this injection if your pain is strong and comes on suddenly rather than if you have had chronic pain. ?This information is not intended to replace advice given to you by your health care provider. Make sure you discuss any questions you have with your health care provider. ?Document Revised: 04/19/2021 Document Reviewed: 11/30/2018 ?Elsevier Patient Education ? 2023 [employer] ? ? ?Low Back Sprain or Strain Rehab ?Ask your health care provider which exercises are safe for you. Do exercises exactly as told by your health care provider and adjust them as directed. It is normal to feel mild stretching, pulling, tightness, or discomfort as you do these exercises. Stop right away if you feel sudden pain or your pain gets worse. Do not begin these exercises until told by your health care provider. ?Stretching and range-of-motion  exercises ?These exercises warm up your muscles and joints and improve the movement and flexibility of your back. These exercises also help to relieve pain, numbness, and tingling. ?Lumbar rotation ? ?Lie on your back on a firm bed or the floor with your knees bent. ?Straighten your arms out to your sides so each arm forms a 90-degree angle (right angle) with a side of your body. ?Slowly move (rotate) both of your knees to one side of your body until you feel a stretch in your lower back (lumbar). Try not to let your shoulders lift off the floor. ?Hold this position for __________ seconds. ?Tense your abdominal muscles and slowly move your knees back to the  starting position. ?Repeat this exercise on the other side of your body. ?Repeat __________ times. Complete this exercise __________ times a day. ?Single knee to chest ? ?Lie on your back on a firm bed or

## 2021-10-17 NOTE — Telephone Encounter (Signed)
Great thank you for letting them know.  ?

## 2021-10-17 NOTE — Telephone Encounter (Signed)
Pt wife called in requesting copy of MRI imaging and result for referral to [facility]... Pt wife stated that pt needs copy of MRI Imaging and results by Friday late morning... Per Lars Mage pt needs to request copy at hospital... Called Pt back to advised to request copy at hospital were MRI was done at...  ?

## 2021-10-17 NOTE — Telephone Encounter (Signed)
Great thank you for letting them know.  ?

## 2021-10-19 DIAGNOSIS — M5416 Radiculopathy, lumbar region: Secondary | ICD-10-CM | POA: Diagnosis not present

## 2021-10-19 DIAGNOSIS — M545 Low back pain, unspecified: Secondary | ICD-10-CM | POA: Diagnosis not present

## 2021-10-19 DIAGNOSIS — E139 Other specified diabetes mellitus without complications: Secondary | ICD-10-CM | POA: Diagnosis not present

## 2021-10-19 DIAGNOSIS — E118 Type 2 diabetes mellitus with unspecified complications: Secondary | ICD-10-CM | POA: Insufficient documentation

## 2021-10-19 DIAGNOSIS — E119 Type 2 diabetes mellitus without complications: Secondary | ICD-10-CM | POA: Insufficient documentation

## 2021-10-23 ENCOUNTER — Telehealth: Payer: Self-pay

## 2021-10-23 DIAGNOSIS — M6281 Muscle weakness (generalized): Secondary | ICD-10-CM | POA: Diagnosis not present

## 2021-10-23 DIAGNOSIS — M799 Soft tissue disorder, unspecified: Secondary | ICD-10-CM | POA: Diagnosis not present

## 2021-10-23 DIAGNOSIS — M545 Low back pain, unspecified: Secondary | ICD-10-CM | POA: Diagnosis not present

## 2021-10-23 NOTE — Telephone Encounter (Signed)
Patient states he has been the ED for his back pain and the doctor there gave him methocarbamol (ROBAXIN) 500 MG tablet

## 2021-10-23 NOTE — Telephone Encounter (Signed)
Continuation of note below...  Patient states he has one day's worth left of medication and he is leaving on vacation tomorrow.  Patient would like to request a refill of his methocarbamol (ROBAXIN) 500 MG tablet.  Patient states he takes them every eight hours.  *Patient states his preferred pharmacy is [facility] on [address].

## 2021-10-23 NOTE — Telephone Encounter (Signed)
Pt want an update on medication refill  

## 2021-10-24 ENCOUNTER — Other Ambulatory Visit: Payer: Self-pay | Admitting: Internal Medicine

## 2021-10-24 MED ORDER — METHOCARBAMOL 500 MG PO TABS
500.0000 mg | ORAL_TABLET | Freq: Two times a day (BID) | ORAL | 2 refills | Status: DC | PRN
Start: 2021-10-24 — End: 2022-02-06

## 2021-10-24 NOTE — Telephone Encounter (Signed)
Rx sent 

## 2021-11-07 DIAGNOSIS — M545 Low back pain, unspecified: Secondary | ICD-10-CM | POA: Diagnosis not present

## 2021-11-07 DIAGNOSIS — M6281 Muscle weakness (generalized): Secondary | ICD-10-CM | POA: Diagnosis not present

## 2021-11-07 DIAGNOSIS — M799 Soft tissue disorder, unspecified: Secondary | ICD-10-CM | POA: Diagnosis not present

## 2021-11-09 NOTE — Telephone Encounter (Signed)
Pt called in requesting to speak with you about his back pain and other options... Pt requesting callback.John KitchenMarland Orozco

## 2021-11-12 ENCOUNTER — Ambulatory Visit (INDEPENDENT_AMBULATORY_CARE_PROVIDER_SITE_OTHER): Payer: BC Managed Care – PPO | Admitting: Internal Medicine

## 2021-11-12 VITALS — BP 123/80 | HR 82 | Resp 16 | Ht 70.0 in | Wt 349.0 lb

## 2021-11-12 DIAGNOSIS — G4733 Obstructive sleep apnea (adult) (pediatric): Secondary | ICD-10-CM | POA: Diagnosis not present

## 2021-11-12 DIAGNOSIS — E1159 Type 2 diabetes mellitus with other circulatory complications: Secondary | ICD-10-CM

## 2021-11-12 DIAGNOSIS — I152 Hypertension secondary to endocrine disorders: Secondary | ICD-10-CM

## 2021-11-12 DIAGNOSIS — Z9989 Dependence on other enabling machines and devices: Secondary | ICD-10-CM

## 2021-11-12 DIAGNOSIS — Z7189 Other specified counseling: Secondary | ICD-10-CM

## 2021-11-12 DIAGNOSIS — Z6841 Body Mass Index (BMI) 40.0 and over, adult: Secondary | ICD-10-CM

## 2021-11-12 NOTE — Progress Notes (Signed)
[facility] [address], [place] [address]  Pulmonary Sleep Medicine   Office Visit Note  Patient Name: Miciah Covelli DOB: 11-Nov-1960 MRN [id number]    Chief Complaint: Obstructive Sleep Apnea visit  Brief History:  Gladys is seen today for follow up.  The patient has a 11 year  history of sleep apnea. Patient in using PAP nightly  medium F20 full face.  The patient feels good after sleeping with PAP.  Epworth Sleepiness Score is 3 out of 24. The patient does not take naps. The patient complains of the following: no problems. He has purchased a Counsellor.   The compliance download shows  compliance with an average use time of 6:40 hours @ 99%. The AHI is 1.9  The patient does not complain of limb movements disrupting sleep.  ROS  General: (-) fever, (-) chills, (-) night sweat Nose and Sinuses: (-) nasal stuffiness or itchiness, (-) postnasal drip, (-) nosebleeds, (-) sinus trouble. Mouth and Throat: (-) sore throat, (-) hoarseness. Neck: (-) swollen glands, (-) enlarged thyroid, (-) neck pain. Respiratory: - cough, - shortness of breath, - wheezing. Neurologic: - numbness, - tingling. Psychiatric: - anxiety, - depression   Current Medication: Outpatient Encounter Medications as of 11/12/2021  Medication Sig   aspirin 81 MG chewable tablet Chew 81 mg by mouth daily.   Azelastine HCl 0.15 % SOLN Place 1-2 sprays into the nose daily as needed. If 2 sprays only use 1x per day. (Patient not taking: Reported on 10/16/2021)   blood glucose meter kit and supplies Dispense based on patient and insurance preference. Use up to four times daily as directed. (FOR ICD-10 E10.9, E11.9). 1 year lancets and strips   EQ ALLERGY RELIEF 10 MG tablet TAKE 1 TABLET BY MOUTH ONCE DAILY AS NEEDED FOR ALLERGIES   Insulin Pen Needle (PEN NEEDLES) 30G X 8 MM MISC 1 Device by Does not apply route once a week. Pen needles for ozempic   lidocaine (LIDODERM) 5 % Place 1 patch onto the  skin every 12 (twelve) hours. Remove & Discard patch within 12 hours or as directed by MD   lisinopril (ZESTRIL) 30 MG tablet Take 1 tablet (30 mg total) by mouth daily.   meloxicam (MOBIC) 15 MG tablet Take 1 tablet (15 mg total) by mouth daily.   methocarbamol (ROBAXIN) 500 MG tablet Take 1 tablet (500 mg total) by mouth 2 (two) times daily as needed for muscle spasms.   methylPREDNISolone (MEDROL DOSEPAK) 4 MG TBPK tablet Use as directed in the am   mupirocin ointment (BACTROBAN) 2 % Apply 1 application. topically 2 (two) times daily. Right leg wound x 7-14 days   Semaglutide,0.25 or 0.5MG/DOS, (OZEMPIC, 0.25 OR 0.5 MG/DOSE,) 2 MG/3ML SOPN Inject 0.5 mg into the skin once a week. 0.5 weekly after 1 month if tolerating can increase to 1 mg weekly   No facility-administered encounter medications on file as of 11/12/2021.    Surgical History: Past Surgical History:  Procedure Laterality Date   knee surgery     left 61 y.o    UMBILICAL HERNIA REPAIR      Medical History: Past Medical History:  Diagnosis Date   Arthritis    COVID-19    12/2020   Diabetes mellitus without complication ([place])    Essential hypertension    Hyperlipidemia    Left knee pain    Lymphedema    Morbid obesity with BMI of 50.0-59.9, adult (HCC)    OSA on  CPAP     Family History: Non contributory to the present illness  Social History: Social History   Socioeconomic History   Marital status: Married    Spouse name: Not on file   Number of children: Not on file   Years of education: Not on file   Highest education level: Not on file  Occupational History   Not on file  Tobacco Use   Smoking status: Never   Smokeless tobacco: Never  Substance and Sexual Activity   Alcohol use: Yes    Alcohol/week: 1.0 standard drink of alcohol    Types: 1 Cans of beer per week    Comment: rare   Drug use: No   Sexual activity: Yes    Partners: Female  Other Topics Concern   Not on file  Social History  Narrative   Married    Kids (daughter)    grandson   Dump Truck driver -Engineer, materials      Social Determinants of Health   Financial Resource Strain: Not on file  Food Insecurity: Not on file  Transportation Needs: Not on file  Physical Activity: Not on file  Stress: Not on file  Social Connections: Not on file  Intimate Partner Violence: Not on file    Vital Signs: Blood pressure 123/80, pulse 82, resp. rate 16, height '5\' 10"'  (1.778 m), weight (!) 349 lb (158.3 kg), SpO2 94 %. Body mass index is 50.08 kg/m.    Examination: General Appearance: The patient is well-developed, well-nourished, and in no distress. Neck Circumference: 48 cm Skin: Gross inspection of skin unremarkable. Head: normocephalic, no gross deformities. Eyes: no gross deformities noted. ENT: ears appear grossly normal Neurologic: Alert and oriented. No involuntary movements.    EPWORTH SLEEPINESS SCALE:  Scale:  (0)= no chance of dozing; (1)= slight chance of dozing; (2)= moderate chance of dozing; (3)= high chance of dozing  Chance  Situtation    Sitting and reading: 1    Watching TV: 1    Sitting Inactive in public: 0    As a passenger in car: 1      Lying down to rest: 0    Sitting and talking: 0    Sitting quielty after lunch: 0    In a car, stopped in traffic: 0   TOTAL SCORE:   3 out of 24    SLEEP STUDIES:  PSG 01/03/11  -  overall AHI 55,  supine AHI 56,  REM AHI 77, min SpO2 50%,    CPAP COMPLIANCE DATA:  Date Range: 08/07/21 -  11/04/21   Average Daily Use: 6:40 hours  Median Use: 6:34 hours  Compliance for > 4 Hours: 99% days  AHI: 1.9 respiratory events per hour  Days Used: 89/90  Mask Leak:5.4 lpm  95th Percentile Pressure: 14 cmH2O    LABS: Recent Results (from the past 2160 hour(s))  Comprehensive metabolic panel     Status: Abnormal   Collection Time: 09/10/21  7:36 AM  Result Value Ref Range   Sodium 138 135 - 145 mEq/L   Potassium 4.4 3.5 - 5.1 mEq/L    Chloride 103 96 - 112 mEq/L   CO2 25 19 - 32 mEq/L   Glucose, Bld 157 (H) 70 - 99 mg/dL   BUN 18 6 - 23 mg/dL   Creatinine, Ser 0.92 0.40 - 1.50 mg/dL   Total Bilirubin 0.5 0.2 - 1.2 mg/dL   Alkaline Phosphatase 38 (L) 39 - 117 U/L   AST 26 0 -  37 U/L   ALT 50 0 - 53 U/L   Total Protein 6.4 6.0 - 8.3 g/dL   Albumin 4.2 3.5 - 5.2 g/dL   GFR 90.18 >60.00 mL/min    Comment: Calculated using the CKD-EPI Creatinine Equation (2021)   Calcium 9.4 8.4 - 10.5 mg/dL  VITAMIN D 25 Hydroxy (Vit-D Deficiency, Fractures)     Status: None   Collection Time: 09/10/21  7:36 AM  Result Value Ref Range   VITD 47.81 30.00 - 100.00 ng/mL  PSA     Status: None   Collection Time: 09/10/21  7:36 AM  Result Value Ref Range   PSA 0.82 0.10 - 4.00 ng/mL    Comment: Test performed using Access Hybritech PSA Assay, a parmagnetic partical, chemiluminecent immunoassay.  Hemoglobin A1c     Status: Abnormal   Collection Time: 09/10/21  7:36 AM  Result Value Ref Range   Hgb A1c MFr Bld 7.3 (H) 4.6 - 6.5 %    Comment: Glycemic Control Guidelines for People with Diabetes:Non Diabetic:  <6%Goal of Therapy: <7%Additional Action Suggested:  >8%   CBC with Differential/Platelet     Status: None   Collection Time: 09/10/21  7:36 AM  Result Value Ref Range   WBC 7.6 4.0 - 10.5 K/uL   RBC 4.92 4.22 - 5.81 Mil/uL   Hemoglobin 15.6 13.0 - 17.0 g/dL   HCT 45.6 39.0 - 52.0 %   MCV 92.6 78.0 - 100.0 fl   MCHC 34.3 30.0 - 36.0 g/dL   RDW 13.5 11.5 - 15.5 %   Platelets 272.0 150.0 - 400.0 K/uL   Neutrophils Relative % 64.3 43.0 - 77.0 %   Lymphocytes Relative 22.9 12.0 - 46.0 %   Monocytes Relative 8.3 3.0 - 12.0 %   Eosinophils Relative 3.8 0.0 - 5.0 %   Basophils Relative 0.7 0.0 - 3.0 %   Neutro Abs 4.9 1.4 - 7.7 K/uL   Lymphs Abs 1.7 0.7 - 4.0 K/uL   Monocytes Absolute 0.6 0.1 - 1.0 K/uL   Eosinophils Absolute 0.3 0.0 - 0.7 K/uL   Basophils Absolute 0.1 0.0 - 0.1 K/uL  Lipid panel     Status: Abnormal    Collection Time: 09/10/21  7:36 AM  Result Value Ref Range   Cholesterol 163 0 - 200 mg/dL    Comment: ATP III Classification       Desirable:  < 200 mg/dL               Borderline High:  200 - 239 mg/dL          High:  > = 240 mg/dL   Triglycerides 133.0 0.0 - 149.0 mg/dL    Comment: Normal:  <150 mg/dLBorderline High:  150 - 199 mg/dL   HDL 37.90 (L) >39.00 mg/dL   VLDL 26.6 0.0 - 40.0 mg/dL   LDL Cholesterol 98 0 - 99 mg/dL   Total CHOL/HDL Ratio 4     Comment:                Men          Women1/2 Average Risk     3.4          3.3Average Risk          5.0          4.42X Average Risk          9.6          7.13X Average Risk  15.0          11.0                       NonHDL 125.08     Comment: NOTE:  Non-HDL goal should be 30 mg/dL higher than patient's LDL goal (i.e. LDL goal of < 70 mg/dL, would have non-HDL goal of < 100 mg/dL)  Urinalysis, Routine w reflex microscopic     Status: None   Collection Time: 09/10/21  7:36 AM  Result Value Ref Range   Color, Urine YELLOW Yellow;Lt. Yellow;Straw;Dark Yellow;Amber;Green;Red;Brown   APPearance CLEAR Clear;Turbid;Slightly Cloudy;Cloudy   Specific Gravity, Urine 1.025 1.000 - 1.030   pH 5.5 5.0 - 8.0   Total Protein, Urine NEGATIVE Negative   Urine Glucose NEGATIVE Negative   Ketones, ur NEGATIVE Negative   Bilirubin Urine NEGATIVE Negative   Hgb urine dipstick NEGATIVE Negative   Urobilinogen, UA 0.2 0.0 - 1.0   Leukocytes,Ua NEGATIVE Negative   Nitrite NEGATIVE Negative   WBC, UA 0-2/hpf 0-2/hpf   RBC / HPF none seen 0-2/hpf   Squamous Epithelial / LPF Rare(0-4/hpf) Rare(0-4/hpf)  TSH     Status: None   Collection Time: 09/10/21  7:36 AM  Result Value Ref Range   TSH 2.98 0.35 - 5.50 uIU/mL    Radiology: MR LUMBAR SPINE WO CONTRAST  Result Date: 10/14/2021 CLINICAL DATA:  Uncontrolled low back pain. EXAM: MRI LUMBAR SPINE WITHOUT CONTRAST TECHNIQUE: Multiplanar, multisequence MR imaging of the lumbar spine was performed.  No intravenous contrast was administered. COMPARISON:  None Available. FINDINGS: Segmentation:  Standard. Alignment: 4 mm retrolisthesis of L3 on L4. Mild straightening of the normal lumbar lordosis. Vertebrae: No fracture, suspicious marrow lesion, or significant marrow edema. Conus medullaris and cauda equina: Conus extends to the L2 level. Conus and cauda equina appear normal. Paraspinal and other soft tissues: Unremarkable. Disc levels: Disc desiccation throughout the lumbar spine with moderate disc space narrowing at L2-3 and L3-4. Diffuse congenital narrowing of the lumbar spinal canal due to short pedicles. T12-L1: Negative. L1-2: Negative. L2-3: Disc bulging and prominent epidural fat result in mild spinal stenosis without neural foraminal stenosis. L3-4: Circumferential disc bulging eccentric to the left, endplate spurring, and mild facet hypertrophy result in mild left lateral recess stenosis and mild-to-moderate left greater than right neural foraminal stenosis. Potential left L3 nerve root impingement. L4-5: Circumferential disc bulging, endplate spurring, and mild facet hypertrophy result in mild-to-moderate bilateral neural foraminal stenosis without spinal stenosis. L5-S1: Disc bulging and mild facet hypertrophy result in mild right and mild-to-moderate left neural foraminal stenosis. There is a shallow right paracentral to subarticular disc protrusion without significant spinal or lateral recess stenosis. IMPRESSION: Congenitally short pedicles with superimposed disc and facet degeneration resulting in mild spinal stenosis at L2-3 and mild-to-moderate neural foraminal stenosis at L3-4, L4-5, and L5-S1. Electronically Signed   By: Logan Bores M.D.   On: 10/14/2021 14:29    No results found.  MR LUMBAR SPINE WO CONTRAST  Result Date: 10/14/2021 CLINICAL DATA:  Uncontrolled low back pain. EXAM: MRI LUMBAR SPINE WITHOUT CONTRAST TECHNIQUE: Multiplanar, multisequence MR imaging of the lumbar spine  was performed. No intravenous contrast was administered. COMPARISON:  None Available. FINDINGS: Segmentation:  Standard. Alignment: 4 mm retrolisthesis of L3 on L4. Mild straightening of the normal lumbar lordosis. Vertebrae: No fracture, suspicious marrow lesion, or significant marrow edema. Conus medullaris and cauda equina: Conus extends to the L2 level. Conus and cauda equina appear normal. Paraspinal  and other soft tissues: Unremarkable. Disc levels: Disc desiccation throughout the lumbar spine with moderate disc space narrowing at L2-3 and L3-4. Diffuse congenital narrowing of the lumbar spinal canal due to short pedicles. T12-L1: Negative. L1-2: Negative. L2-3: Disc bulging and prominent epidural fat result in mild spinal stenosis without neural foraminal stenosis. L3-4: Circumferential disc bulging eccentric to the left, endplate spurring, and mild facet hypertrophy result in mild left lateral recess stenosis and mild-to-moderate left greater than right neural foraminal stenosis. Potential left L3 nerve root impingement. L4-5: Circumferential disc bulging, endplate spurring, and mild facet hypertrophy result in mild-to-moderate bilateral neural foraminal stenosis without spinal stenosis. L5-S1: Disc bulging and mild facet hypertrophy result in mild right and mild-to-moderate left neural foraminal stenosis. There is a shallow right paracentral to subarticular disc protrusion without significant spinal or lateral recess stenosis. IMPRESSION: Congenitally short pedicles with superimposed disc and facet degeneration resulting in mild spinal stenosis at L2-3 and mild-to-moderate neural foraminal stenosis at L3-4, L4-5, and L5-S1. Electronically Signed   By: Logan Bores M.D.   On: 10/14/2021 14:29      Assessment and Plan: Patient Active Problem List   Diagnosis Date Noted   CPAP use counseling 11/12/2021   Spinal stenosis of lumbosacral region 10/16/2021   Arthritis of lumbar spine 10/16/2021   Bulging  lumbar disc 10/16/2021   Hypertension associated with diabetes ([place]) 09/13/2021   Vitamin D deficiency 11/13/2018   Allergic rhinitis 04/17/2018   Left knee pain 04/17/2018   Prediabetes 04/17/2018   Hyperlipidemia 04/17/2018   Lymphedema 07/15/2017   Encounter for health maintenance examination with abnormal findings 09/05/2016   Essential hypertension    OSA on CPAP    Morbid obesity with BMI of 50.0-59.9, adult ([place])     1. OSA on CPAP The patient does tolerate PAP and reports  benefit from PAP use. The patient was reminded how to clean equipment and advised to replace supplies routinely. The patient was also counselled on weight loss. The compliance is excellent . The AHI is 1.8.   OSA on cpap- continue with excellent compliance. PAP continues to be medically necessary to treat the patient's OSA. F/u one year.    2. CPAP use counseling CPAP Counseling: had a lengthy discussion with the patient regarding the importance of PAP therapy in management of the sleep apnea. Patient appears to understand the risk factor reduction and also understands the risks associated with untreated sleep apnea. Patient will try to make a good faith effort to remain compliant with therapy. Also instructed the patient on proper cleaning of the device including the water must be changed daily if possible and use of distilled water is preferred. Patient understands that the machine should be regularly cleaned with appropriate recommended cleaning solutions that do not damage the PAP machine for example given white vinegar and water rinses. Other methods such as ozone treatment may not be as good as these simple methods to achieve cleaning.   3. Morbid obesity with BMI of 50.0-59.9, adult ([place]) Obesity Counseling: Had a lengthy discussion regarding patients BMI and weight issues. Patient was instructed on portion control as well as increased activity. Also discussed caloric restrictions with trying to maintain  intake less than 2000 Kcal. Discussions were made in accordance with the 5As of weight management. Simple actions such as not eating late and if able to, taking a walk is suggested.   4. Hypertension associated with diabetes ([place]) Hypertension Counseling:   The following hypertensive lifestyle modification were recommended  and discussed:  1. Limiting alcohol intake to less than 1 oz/day of ethanol:(24 oz of beer or 8 oz of wine or 2 oz of 100-proof whiskey). 2. Take baby ASA 81 mg daily. 3. Importance of regular aerobic exercise and losing weight. 4. Reduce dietary saturated fat and cholesterol intake for overall cardiovascular health. 5. Maintaining adequate dietary potassium, calcium, and magnesium intake. 6. Regular monitoring of the blood pressure. 7. Reduce sodium intake to less than 100 mmol/day (less than 2.3 gm of sodium or less than 6 gm of sodium choride)      General Counseling: I have discussed the findings of the evaluation and examination with Jeneen Rinks.  I have also discussed any further diagnostic evaluation thatmay be needed or ordered today. Rutilio verbalizes understanding of the findings of todays visit. We also reviewed his medications today and discussed drug interactions and side effects including but not limited excessive drowsiness and altered mental states. We also discussed that there is always a risk not just to him but also people around him. he has been encouraged to call the office with any questions or concerns that should arise related to todays visit.  No orders of the defined types were placed in this encounter.       I have personally obtained a history, examined the patient, evaluated laboratory and imaging results, formulated the assessment and plan and placed orders. This patient was seen today by Tressie Ellis, PA-C in collaboration with Dr. Devona Konig.   Allyne Gee, MD [facility] Diplomate ABMS Pulmonary Critical Care Medicine and Sleep Medicine

## 2021-11-12 NOTE — Patient Instructions (Signed)

## 2021-11-14 ENCOUNTER — Ambulatory Visit (INDEPENDENT_AMBULATORY_CARE_PROVIDER_SITE_OTHER): Payer: BC Managed Care – PPO | Admitting: Internal Medicine

## 2021-11-14 ENCOUNTER — Encounter: Payer: Self-pay | Admitting: Internal Medicine

## 2021-11-14 DIAGNOSIS — M5136 Other intervertebral disc degeneration, lumbar region: Secondary | ICD-10-CM | POA: Diagnosis not present

## 2021-11-14 DIAGNOSIS — M4807 Spinal stenosis, lumbosacral region: Secondary | ICD-10-CM

## 2021-11-14 DIAGNOSIS — M47816 Spondylosis without myelopathy or radiculopathy, lumbar region: Secondary | ICD-10-CM | POA: Diagnosis not present

## 2021-11-14 DIAGNOSIS — I10 Essential (primary) hypertension: Secondary | ICD-10-CM | POA: Diagnosis not present

## 2021-11-14 DIAGNOSIS — G8929 Other chronic pain: Secondary | ICD-10-CM

## 2021-11-14 DIAGNOSIS — M545 Low back pain, unspecified: Secondary | ICD-10-CM | POA: Insufficient documentation

## 2021-11-14 MED ORDER — TRAMADOL HCL 50 MG PO TABS: 50.0000 mg | ORAL_TABLET | Freq: Two times a day (BID) | ORAL | 0 refills | Status: DC | PRN

## 2021-11-14 NOTE — Progress Notes (Signed)
Telephone Note  I connected with John Orozco  on 11/14/21 at 11:40 AM EDT by telephone and verified that I am speaking with the correct person using two identifiers.  Location patient: [place] Location provider:work or home office Persons participating in the virtual visit: patient, provider  I discussed the limitations and requested verbal permission for telemedicine visit. The patient expressed understanding and agreed to proceed.   HPI:  Acute telemedicine visit for : Chronic low back pain I.e left sided on mobic 15 mg qd and aleve and robaxin 500 mg bid to tid established with [facility] and doing PT and had steroid taper. 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 Uses icy hot and also taking aleve  Dm 2 and morbid obesity a1c 7.3 on ozempic 0.5 lost 15 lbs and not as hungry and sugars lower   -Pertinent past medical history: see below -Pertinent medication allergies:No Known Allergies -COVID-19 vaccine status:  Immunization History  Administered Date(s) Administered   Influenza,inj,Quad PF,6+ Mos 07/14/2017, 04/17/2018   PFIZER(Purple Top)SARS-COV-2 Vaccination 09/11/2019, 10/09/2019   Tdap 09/13/2021     ROS: See pertinent positives and negatives per HPI.  Past Medical History:  Diagnosis Date   Arthritis    COVID-19    12/2020   Diabetes mellitus without complication ([place])    Essential hypertension    Hyperlipidemia    Left knee pain    Lymphedema    Morbid obesity with BMI of 50.0-59.9, adult (HCC)    OSA on CPAP     Past Surgical History:  Procedure Laterality Date   knee surgery     left 61 y.o    UMBILICAL HERNIA REPAIR       Current Outpatient Medications:    aspirin 81 MG chewable tablet, Chew 81 mg by mouth daily., Disp: , Rfl:    blood glucose meter kit and supplies, Dispense based on patient and insurance preference. Use up to four times daily as directed. (FOR ICD-10 E10.9, E11.9). 1 year lancets and strips, Disp: 1 each, Rfl: 0    EQ ALLERGY RELIEF 10 MG tablet, TAKE 1 TABLET BY MOUTH ONCE DAILY AS NEEDED FOR ALLERGIES, Disp: 90 tablet, Rfl: 0   Insulin Pen Needle (PEN NEEDLES) 30G X 8 MM MISC, 1 Device by Does not apply route once a week. Pen needles for ozempic, Disp: 12 each, Rfl: 3   lidocaine (LIDODERM) 5 %, Place 1 patch onto the skin every 12 (twelve) hours. Remove & Discard patch within 12 hours or as directed by MD, Disp: 10 patch, Rfl: 0   lisinopril (ZESTRIL) 30 MG tablet, Take 1 tablet (30 mg total) by mouth daily., Disp: 90 tablet, Rfl: 3   meloxicam (MOBIC) 15 MG tablet, Take 1 tablet (15 mg total) by mouth daily., Disp: 30 tablet, Rfl: 0   methocarbamol (ROBAXIN) 500 MG tablet, Take 1 tablet (500 mg total) by mouth 2 (two) times daily as needed for muscle spasms., Disp: 60 tablet, Rfl: 2   methylPREDNISolone (MEDROL DOSEPAK) 4 MG TBPK tablet, Use as directed in the am, Disp: 21 tablet, Rfl: 0   mupirocin ointment (BACTROBAN) 2 %, Apply 1 application. topically 2 (two) times daily. Right leg wound x 7-14 days, Disp: 30 g, Rfl: 0   Semaglutide,0.25 or 0.5MG/DOS, (OZEMPIC, 0.25 OR 0.5 MG/DOSE,) 2 MG/3ML SOPN, Inject 0.5 mg into the skin once a week. 0.5 weekly after 1 month if tolerating can increase to 1 mg weekly, Disp: 3 mL, Rfl: 2  Azelastine HCl 0.15 % SOLN, Place 1-2 sprays into the nose daily as needed. If 2 sprays only use 1x per day. (Patient not taking: Reported on 10/16/2021), Disp: 30 mL, Rfl: 11  EXAM:  VITALS per patient if applicable:  GENERAL: alert, oriented, appears well and in no acute distress  PSYCH/NEURO: pleasant and cooperative, no obvious depression or anxiety, speech and thought processing grossly intact  ASSESSMENT AND PLAN:  Discussed the following assessment and plan:  Spinal stenosis of lumbosacral region Arthritis of lumbar spine Bulging lumbar disc Chronic low back pain  Tramadol 50 mg bid pain contract today  Hypertension - Plan: Basic Metabolic Panel (BMET) On  lisinopril 30 mg qd   Dm 2 A1c 7.3  Now on ozempic 0.5 weekly and lost 15 lbs   HM Flu shot utd  Tdap utd Consider hep B vx in future Hep c negative Pfizer per pt had 4/4, pfizer had 04/2020, 10/01/20, 04/09/21   Declines HIV testing  Disc exercise to lose wt and healthy diet choices  Never smoker  Colonoscopy had in [place] or 2015 no FH colon  Call back when ready for colonoscopy 09/13/21 not ready as of 09/13/21 Psa 0.82 declines dre 09/13/21   -we discussed possible serious and likely etiologies, options for evaluation and workup, limitations of telemedicine visit vs in person visit, treatment, treatment risks and precautions. Pt is agreeable to treatment via telemedicine at this moment.   I discussed the assessment and treatment plan with the patient. The patient was provided an opportunity to ask questions and all were answered. The patient agreed with the plan and demonstrated an understanding of the instructions.    Time spent 20 min Delorise Jackson, MD

## 2021-12-14 ENCOUNTER — Other Ambulatory Visit: Payer: Self-pay | Admitting: Internal Medicine

## 2021-12-14 DIAGNOSIS — J309 Allergic rhinitis, unspecified: Secondary | ICD-10-CM

## 2021-12-26 ENCOUNTER — Telehealth: Payer: Self-pay

## 2021-12-26 NOTE — Telephone Encounter (Signed)
Patient states we have a form he needs to sign for his prescription.  I spoke with Essie Hart, CMA, and he is preparing the form for patient.  Patient will plan to come by and sign form around 1pm today.

## 2021-12-28 ENCOUNTER — Telehealth: Payer: Self-pay | Admitting: Internal Medicine

## 2021-12-28 ENCOUNTER — Other Ambulatory Visit: Payer: Self-pay | Admitting: Internal Medicine

## 2021-12-28 DIAGNOSIS — I152 Hypertension secondary to endocrine disorders: Secondary | ICD-10-CM

## 2021-12-28 MED ORDER — SEMAGLUTIDE (1 MG/DOSE) 4 MG/3ML ~~LOC~~ SOPN: 1.0000 mg | PEN_INJECTOR | SUBCUTANEOUS | 2 refills | Status: DC

## 2021-12-28 NOTE — Telephone Encounter (Signed)
Pt called in requesting for refill on medication (Semaglutide,0.25 or 0.5MG /DOS, (OZEMPIC, 0.25 OR 0.5 MG/DOSE,) 2 MG/3ML SOPN)... Pt stated he would like to go up on his dosage to 1mg ... Pt stated that he received 0.5mg  dosage again... Pt would like to know if he should take two of the 0.5mg ... Pt requesting callback. Marland Kitchen

## 2021-12-28 NOTE — Telephone Encounter (Signed)
He can take 2-0.5 mg but ive sent 1 mg weekly dose as well if can tolerate in 1 month can increase to 2 mg weekly

## 2021-12-31 NOTE — Telephone Encounter (Signed)
Lvm to inform pt that ok to take 2-0.5 mg of ozempic & 1mg  has been sent to pharmacy as well & if able to tolerate he can increase to 2 mg wkly in 1 mon

## 2022-01-14 ENCOUNTER — Other Ambulatory Visit: Payer: Self-pay | Admitting: Internal Medicine

## 2022-01-14 DIAGNOSIS — M4807 Spinal stenosis, lumbosacral region: Secondary | ICD-10-CM

## 2022-01-14 DIAGNOSIS — M5136 Other intervertebral disc degeneration, lumbar region: Secondary | ICD-10-CM

## 2022-01-14 DIAGNOSIS — G8929 Other chronic pain: Secondary | ICD-10-CM

## 2022-01-14 DIAGNOSIS — M47816 Spondylosis without myelopathy or radiculopathy, lumbar region: Secondary | ICD-10-CM

## 2022-02-05 ENCOUNTER — Other Ambulatory Visit: Payer: Self-pay | Admitting: Internal Medicine

## 2022-03-08 ENCOUNTER — Telehealth: Payer: Self-pay | Admitting: Internal Medicine

## 2022-03-08 DIAGNOSIS — I152 Hypertension secondary to endocrine disorders: Secondary | ICD-10-CM

## 2022-03-10 ENCOUNTER — Telehealth: Payer: Self-pay | Admitting: Family

## 2022-03-10 DIAGNOSIS — J309 Allergic rhinitis, unspecified: Secondary | ICD-10-CM

## 2022-03-15 ENCOUNTER — Other Ambulatory Visit: Payer: Self-pay | Admitting: Internal Medicine

## 2022-03-15 ENCOUNTER — Telehealth: Payer: Self-pay

## 2022-03-15 ENCOUNTER — Other Ambulatory Visit: Payer: Self-pay | Admitting: Family

## 2022-03-15 DIAGNOSIS — M51369 Other intervertebral disc degeneration, lumbar region without mention of lumbar back pain or lower extremity pain: Secondary | ICD-10-CM

## 2022-03-15 DIAGNOSIS — M47816 Spondylosis without myelopathy or radiculopathy, lumbar region: Secondary | ICD-10-CM

## 2022-03-15 DIAGNOSIS — G8929 Other chronic pain: Secondary | ICD-10-CM

## 2022-03-15 DIAGNOSIS — M4807 Spinal stenosis, lumbosacral region: Secondary | ICD-10-CM

## 2022-03-15 DIAGNOSIS — M5136 Other intervertebral disc degeneration, lumbar region: Secondary | ICD-10-CM

## 2022-03-15 DIAGNOSIS — M545 Low back pain, unspecified: Secondary | ICD-10-CM

## 2022-03-15 MED ORDER — TRAMADOL HCL 50 MG PO TABS: 50.0000 mg | ORAL_TABLET | Freq: Two times a day (BID) | ORAL | 2 refills | Status: DC | PRN

## 2022-03-15 NOTE — Telephone Encounter (Signed)
Patient states he needs a refill for his loratadine (CLARITIN) 10 MG tablet and traMADol (ULTRAM) 50 MG tablet.  Patient states his pharmacy told his wife that they don't even have a prescription for the traMADol (ULTRAM) 50 MG tablet.  Patient states pharmacy states they have tried to contact us about refilling the prescription for loratadine (CLARITIN) 10 MG tablet.  *Patient states his preferred pharmacy is [facility] on [address].

## 2022-03-19 ENCOUNTER — Ambulatory Visit: Payer: BC Managed Care – PPO | Admitting: Internal Medicine

## 2022-04-08 ENCOUNTER — Other Ambulatory Visit: Payer: Self-pay

## 2022-04-08 ENCOUNTER — Other Ambulatory Visit: Payer: Self-pay | Admitting: Family

## 2022-04-08 DIAGNOSIS — J309 Allergic rhinitis, unspecified: Secondary | ICD-10-CM

## 2022-04-08 DIAGNOSIS — I152 Hypertension secondary to endocrine disorders: Secondary | ICD-10-CM

## 2022-04-08 MED ORDER — LORATADINE 10 MG PO TABS: ORAL_TABLET | ORAL | 0 refills | Status: DC

## 2022-04-08 MED ORDER — SEMAGLUTIDE (2 MG/DOSE) 8 MG/3ML ~~LOC~~ SOPN: 2.0000 mg | PEN_INJECTOR | SUBCUTANEOUS | 1 refills | Status: DC

## 2022-04-08 NOTE — Telephone Encounter (Signed)
Refill has been sent.  °

## 2022-04-10 ENCOUNTER — Telehealth: Payer: Self-pay | Admitting: Internal Medicine

## 2022-04-10 NOTE — Telephone Encounter (Signed)
Pt need a [facility]. Pt would like to be called when it is sent in

## 2022-04-11 ENCOUNTER — Other Ambulatory Visit: Payer: Self-pay | Admitting: Family

## 2022-04-11 MED ORDER — SEMAGLUTIDE (2 MG/DOSE) 8 MG/3ML ~~LOC~~ SOPN
2.0000 mg | PEN_INJECTOR | SUBCUTANEOUS | 1 refills | Status: DC
Start: 2022-04-11 — End: 2022-07-26

## 2022-04-11 NOTE — Telephone Encounter (Signed)
Spoke to Patient regarding his Ozempic . [facility] states [facility] is on [address] and that the Patient will have to call around and see if another Pharmacy has it and transfer his prescription. Patient verbalized understanding and is agreeable.

## 2022-04-11 NOTE — Telephone Encounter (Signed)
Patient states he is running out of Ozemic tomorrow and needs to have a refill.  Patient states he would like to be called back today, because he is very concerned since this can impact his blood sugar levels.  Patient is scheduled to see Dr. Dana Allan on 06/11/2022.  *Patient states his preferred [facility] is [facility] [address].  Patient states he would like to speak with a Production designer, theatre/television/film.  Patient states he feels like we are dropping the ball because he is having to chase [place] around to get his medication.  Henrene Pastor, RN, states she is in a meeting at the moment, but will call patient back.  Olegario Messier states this will be taken care of.  I relayed message to patient.

## 2022-05-01 ENCOUNTER — Telehealth: Payer: Self-pay

## 2022-05-01 ENCOUNTER — Other Ambulatory Visit: Payer: Self-pay | Admitting: Family

## 2022-05-01 MED ORDER — METHOCARBAMOL 500 MG PO TABS: 500.0000 mg | ORAL_TABLET | Freq: Two times a day (BID) | ORAL | 0 refills | Status: DC

## 2022-05-01 NOTE — Telephone Encounter (Signed)
Patient states he needs a refill for his methocarbamol (ROBAXIN) 500 MG tablet.  Patient states he called the pharmacy on Sunday and they are waiting to hear back from [place].  *Patient states his preferred pharmacy is [facility] on [address]

## 2022-06-11 ENCOUNTER — Encounter: Payer: BC Managed Care – PPO | Admitting: Family Medicine

## 2022-07-04 ENCOUNTER — Other Ambulatory Visit: Payer: Self-pay | Admitting: Family

## 2022-07-04 DIAGNOSIS — J309 Allergic rhinitis, unspecified: Secondary | ICD-10-CM

## 2022-07-11 ENCOUNTER — Other Ambulatory Visit: Payer: Self-pay

## 2022-07-22 ENCOUNTER — Other Ambulatory Visit: Payer: Self-pay

## 2022-07-22 DIAGNOSIS — J309 Allergic rhinitis, unspecified: Secondary | ICD-10-CM

## 2022-07-22 MED ORDER — LORATADINE 10 MG PO TABS: ORAL_TABLET | ORAL | 0 refills | Status: DC

## 2022-07-26 ENCOUNTER — Telehealth: Payer: Self-pay | Admitting: Family Medicine

## 2022-07-26 DIAGNOSIS — J309 Allergic rhinitis, unspecified: Secondary | ICD-10-CM

## 2022-07-26 MED ORDER — SEMAGLUTIDE (2 MG/DOSE) 8 MG/3ML ~~LOC~~ SOPN: 2.0000 mg | PEN_INJECTOR | SUBCUTANEOUS | 0 refills | Status: DC

## 2022-07-26 MED ORDER — LORATADINE 10 MG PO TABS: ORAL_TABLET | ORAL | 0 refills | Status: DC

## 2022-07-26 NOTE — Telephone Encounter (Signed)
Refill sent to pharmacy.   

## 2022-07-26 NOTE — Telephone Encounter (Signed)
Pt need a refill on ozempic and [facility]ine [facility]

## 2022-08-25 ENCOUNTER — Other Ambulatory Visit: Payer: Self-pay | Admitting: Family

## 2022-08-25 ENCOUNTER — Other Ambulatory Visit: Payer: Self-pay | Admitting: Family Medicine

## 2022-08-26 NOTE — Telephone Encounter (Signed)
Pt need a refill on [facility]ol [facility] to [facility]

## 2022-09-12 ENCOUNTER — Other Ambulatory Visit (HOSPITAL_COMMUNITY): Payer: Self-pay

## 2022-09-13 ENCOUNTER — Telehealth: Payer: Self-pay

## 2022-09-13 DIAGNOSIS — M4807 Spinal stenosis, lumbosacral region: Secondary | ICD-10-CM

## 2022-09-13 DIAGNOSIS — M47816 Spondylosis without myelopathy or radiculopathy, lumbar region: Secondary | ICD-10-CM

## 2022-09-13 DIAGNOSIS — M5136 Other intervertebral disc degeneration, lumbar region: Secondary | ICD-10-CM

## 2022-09-13 DIAGNOSIS — M545 Low back pain, unspecified: Secondary | ICD-10-CM

## 2022-09-13 MED ORDER — TRAMADOL HCL 50 MG PO TABS: 50.0000 mg | ORAL_TABLET | Freq: Two times a day (BID) | ORAL | 0 refills | Status: DC | PRN

## 2022-09-13 NOTE — Telephone Encounter (Signed)
I called the patent and he stated he has spinal stenosis and he only takes 1 pill per day although Dr. French Ana had given him 2 per day.  Chanetta Moosman,cma

## 2022-09-13 NOTE — Telephone Encounter (Signed)
Controlled substance database reviewed.  Refill provided.

## 2022-09-13 NOTE — Telephone Encounter (Signed)
Prescription Request  09/13/2022  LOV: Visit date not found  What is the name of the medication or equipment?  traMADol (ULTRAM) 50 MG tablet    Have you contacted your pharmacy to request a refill? No  Which pharmacy would you like this sent to?  [facility] [address], [place] - [address] [place] [address] Phone: [phone] Fax: [phone]    Patient notified that their request is being sent to the clinical staff for review and that they should receive a response within 2 business days.   Please advise at Mobile [phone] (mobile)  Patient states he called [place] directly because when he has called his pharmacy in the past, they did not contact us.

## 2022-09-13 NOTE — Telephone Encounter (Signed)
Attempted to call Patient-no answer or voicemail 

## 2022-09-16 NOTE — Telephone Encounter (Signed)
Patient states he picked it up yesterday

## 2022-09-18 ENCOUNTER — Telehealth: Payer: Self-pay | Admitting: Family Medicine

## 2022-09-18 NOTE — Telephone Encounter (Signed)
Pt need a refill [facility]ril [facility]

## 2022-09-19 ENCOUNTER — Other Ambulatory Visit: Payer: Self-pay

## 2022-09-19 DIAGNOSIS — I10 Essential (primary) hypertension: Secondary | ICD-10-CM

## 2022-09-19 MED ORDER — LISINOPRIL 30 MG PO TABS: 30.0000 mg | ORAL_TABLET | Freq: Every day | ORAL | 3 refills | Status: DC

## 2022-09-19 NOTE — Telephone Encounter (Signed)
Refill sent.

## 2022-09-25 ENCOUNTER — Other Ambulatory Visit: Payer: Self-pay | Admitting: Family Medicine

## 2022-09-26 ENCOUNTER — Ambulatory Visit: Payer: BC Managed Care – PPO | Admitting: Nurse Practitioner

## 2022-09-26 ENCOUNTER — Encounter: Payer: Self-pay | Admitting: Nurse Practitioner

## 2022-09-26 VITALS — BP 140/86 | HR 80 | Temp 98.1°F | Ht 70.0 in | Wt 321.6 lb

## 2022-09-26 DIAGNOSIS — J329 Chronic sinusitis, unspecified: Secondary | ICD-10-CM | POA: Diagnosis not present

## 2022-09-26 DIAGNOSIS — R058 Other specified cough: Secondary | ICD-10-CM

## 2022-09-26 MED ORDER — AMOXICILLIN-POT CLAVULANATE 875-125 MG PO TABS: 1.0000 | ORAL_TABLET | Freq: Two times a day (BID) | ORAL | 0 refills | Status: DC

## 2022-09-26 MED ORDER — PREDNISONE 20 MG PO TABS: 20.0000 mg | ORAL_TABLET | Freq: Every day | ORAL | 0 refills | Status: AC

## 2022-09-26 MED ORDER — BENZONATATE 200 MG PO CAPS: 200.0000 mg | ORAL_CAPSULE | Freq: Two times a day (BID) | ORAL | 0 refills | Status: DC | PRN

## 2022-09-26 NOTE — Progress Notes (Signed)
Established Patient Office Visit  Subjective:  Patient ID: John Orozco, male    DOB: 1960-06-19  Age: 62 y.o. MRN: [id number]  CC:  Chief Complaint  Patient presents with   Cough    Cough & congestion started on 09/25/22  Sinus pressure & pain in ears and sinus cavaties    HPI  John Orozco presents for URI symptoms    Cough The current episode started yesterday. The problem has been gradually worsening. The cough is Productive of sputum. Associated symptoms include ear congestion, headaches, nasal congestion, postnasal drip and a sore throat. Pertinent negatives include no wheezing.     Past Medical History:  Diagnosis Date   Arthritis    COVID-19    12/2020   Diabetes mellitus without complication (HCC)    Essential hypertension    Hyperlipidemia    Left knee pain    Lymphedema    Morbid obesity with BMI of 50.0-59.9, adult (HCC)    OSA on CPAP     Past Surgical History:  Procedure Laterality Date   knee surgery     left 62 y.o    UMBILICAL HERNIA REPAIR      Family History  Problem Relation Age of Onset   Hypertension Mother    Heart disease Mother     Social History   Socioeconomic History   Marital status: Married    Spouse name: Not on file   Number of children: Not on file   Years of education: Not on file   Highest education level: Not on file  Occupational History   Not on file  Tobacco Use   Smoking status: Never   Smokeless tobacco: Never  Substance and Sexual Activity   Alcohol use: Yes    Alcohol/week: 1.0 standard drink of alcohol    Types: 1 Cans of beer per week    Comment: rare   Drug use: No   Sexual activity: Yes    Partners: Female  Other Topics Concern   Not on file  Social History Narrative   Married    Kids (daughter)    grandson   Dump Truck driver -Chartered certified accountant      Social Determinants of Health   Financial Resource Strain: Not on file  Food Insecurity: Not on file  Transportation Needs: Not on file  Physical  Activity: Not on file  Stress: Not on file  Social Connections: Not on file  Intimate Partner Violence: Not on file     Outpatient Medications Prior to Visit  Medication Sig Dispense Refill   aspirin 81 MG chewable tablet Chew 81 mg by mouth daily.     Azelastine HCl 0.15 % SOLN Place 1-2 sprays into the nose daily as needed. If 2 sprays only use 1x per day. 30 mL 11   blood glucose meter kit and supplies Dispense based on patient and insurance preference. Use up to four times daily as directed. (FOR ICD-10 E10.9, E11.9). 1 year lancets and strips 1 each 0   Insulin Pen Needle (PEN NEEDLES) 30G X 8 MM MISC 1 Device by Does not apply route once a week. Pen needles for ozempic 12 each 3   lisinopril (ZESTRIL) 30 MG tablet Take 1 tablet (30 mg total) by mouth daily. 90 tablet 3   loratadine (CLARITIN) 10 MG tablet TAKE 1 TABLET BY MOUTH ONCE DAILY AS NEEDED FOR ALLERGIES 90 tablet 0   methocarbamol (ROBAXIN) 500 MG tablet Take 1 tablet by mouth twice daily 120 tablet 0  OZEMPIC, 2 MG/DOSE, 8 MG/3ML SOPN INJECT 2MG  SUBCUTANEOUSLY ONCE WEEKLY 3 mL 0   traMADol (ULTRAM) 50 MG tablet Take 1 tablet (50 mg total) by mouth 2 (two) times daily as needed. 60 tablet 0   lidocaine (LIDODERM) 5 % Place 1 patch onto the skin every 12 (twelve) hours. Remove & Discard patch within 12 hours or as directed by MD 10 patch 0   meloxicam (MOBIC) 15 MG tablet Take 1 tablet (15 mg total) by mouth daily. 30 tablet 0   methylPREDNISolone (MEDROL DOSEPAK) 4 MG TBPK tablet Use as directed in the am (Patient not taking: Reported on 09/26/2022) 21 tablet 0   mupirocin ointment (BACTROBAN) 2 % Apply 1 application. topically 2 (two) times daily. Right leg wound x 7-14 days 30 g 0   No facility-administered medications prior to visit.    No Known Allergies  ROS Review of Systems  Constitutional:  Positive for fatigue.  HENT:  Positive for congestion, postnasal drip, sinus pressure, sinus pain and sore throat.    Respiratory:  Positive for cough. Negative for wheezing.   Neurological:  Positive for headaches.      Objective:    Physical Exam Constitutional:      Appearance: Normal appearance. He is obese.  HENT:     Head: Normocephalic and atraumatic.     Right Ear: Tympanic membrane normal.     Left Ear: Tympanic membrane normal.     Nose:     Right Sinus: Maxillary sinus tenderness and frontal sinus tenderness present.     Left Sinus: Maxillary sinus tenderness and frontal sinus tenderness present.     Mouth/Throat:     Pharynx: Posterior oropharyngeal erythema present. No oropharyngeal exudate.  Cardiovascular:     Rate and Rhythm: Normal rate and regular rhythm.     Pulses: Normal pulses.     Heart sounds: Normal heart sounds. No murmur heard. Pulmonary:     Effort: Pulmonary effort is normal.     Breath sounds: Normal breath sounds. No stridor. No wheezing.  Musculoskeletal:     Right lower leg: No edema.     Left lower leg: No edema.  Skin:    General: Skin is warm.     Capillary Refill: Capillary refill takes less than 2 seconds.  Neurological:     General: No focal deficit present.     Mental Status: He is alert and oriented to person, place, and time. Mental status is at baseline.  Psychiatric:        Mood and Affect: Mood normal.        Behavior: Behavior normal.        Thought Content: Thought content normal.        Judgment: Judgment normal.     BP (!) 140/86   Pulse 80   Temp 98.1 F (36.7 C)   Ht 5\' 10"  (1.778 m)   Wt (!) 321 lb 9.6 oz (145.9 kg)   SpO2 97%   BMI 46.14 kg/m  Wt Readings from Last 3 Encounters:  09/26/22 (!) 321 lb 9.6 oz (145.9 kg)  11/12/21 (!) 349 lb (158.3 kg)  10/16/21 (!) 359 lb 3.2 oz (162.9 kg)     Health Maintenance  Topic Date Due   FOOT EXAM  Never done   OPHTHALMOLOGY EXAM  Never done   HIV Screening  Never done   Diabetic kidney evaluation - Urine ACR  Never done   Zoster Vaccines- Shingrix (1 of 2) Never done  HEMOGLOBIN A1C  03/12/2022   Diabetic kidney evaluation - eGFR measurement  09/11/2022   COVID-19 Vaccine (3 - 2023-24 season) 10/12/2022 (Originally 02/01/2022)   INFLUENZA VACCINE  01/02/2023   COLONOSCOPY (Pts 45-48yrs Insurance coverage will need to be confirmed)  06/04/2023   DTaP/Tdap/Td (2 - Td or Tdap) 09/14/2031   Hepatitis C Screening  Completed   HPV VACCINES  Aged Out    There are no preventive care reminders to display for this patient.  Lab Results  Component Value Date   TSH 2.98 09/10/2021   Lab Results  Component Value Date   WBC 7.6 09/10/2021   HGB 15.6 09/10/2021   HCT 45.6 09/10/2021   MCV 92.6 09/10/2021   PLT 272.0 09/10/2021   Lab Results  Component Value Date   NA 138 09/10/2021   K 4.4 09/10/2021   CO2 25 09/10/2021   GLUCOSE 157 (H) 09/10/2021   BUN 18 09/10/2021   CREATININE 0.92 09/10/2021   BILITOT 0.5 09/10/2021   ALKPHOS 38 (L) 09/10/2021   AST 26 09/10/2021   ALT 50 09/10/2021   PROT 6.4 09/10/2021   ALBUMIN 4.2 09/10/2021   CALCIUM 9.4 09/10/2021   GFR 90.18 09/10/2021   Lab Results  Component Value Date   CHOL 163 09/10/2021   Lab Results  Component Value Date   HDL 37.90 (L) 09/10/2021   Lab Results  Component Value Date   LDLCALC 98 09/10/2021   Lab Results  Component Value Date   TRIG 133.0 09/10/2021   Lab Results  Component Value Date   CHOLHDL 4 09/10/2021   Lab Results  Component Value Date   HGBA1C 7.3 (H) 09/10/2021      Assessment & Plan:  Sinusitis, unspecified chronicity, unspecified location Assessment & Plan: Started on Augmentin and prednisone. Increase fluid intake. Use humidifier and steam. If symptoms are not improving call the office for further evaluation.   Other cough Assessment & Plan: Started on Tessalon Perles 2 times a day as needed for cough. Perform salt water gargles.   Other orders -     Amoxicillin-Pot Clavulanate; Take 1 tablet by mouth 2 (two) times daily.  Dispense: 20  tablet; Refill: 0 -     predniSONE; Take 1 tablet (20 mg total) by mouth daily with breakfast for 5 days.  Dispense: 5 tablet; Refill: 0 -     Benzonatate; Take 1 capsule (200 mg total) by mouth 2 (two) times daily as needed for cough.  Dispense: 20 capsule; Refill: 0    Follow-up: No follow-ups on file.   Kara Dies, NP

## 2022-09-26 NOTE — Patient Instructions (Signed)
Rx sent to pharmacy. If you do not feel better in 1-2 days start the antibiotic.  Increase fluid intake. Use humidifier/steam.

## 2022-09-29 ENCOUNTER — Encounter: Payer: Self-pay | Admitting: Nurse Practitioner

## 2022-09-29 DIAGNOSIS — J329 Chronic sinusitis, unspecified: Secondary | ICD-10-CM | POA: Insufficient documentation

## 2022-09-29 DIAGNOSIS — R059 Cough, unspecified: Secondary | ICD-10-CM | POA: Insufficient documentation

## 2022-09-29 NOTE — Assessment & Plan Note (Signed)
Started on [employer] 2 times a day as needed for cough. Perform salt water gargles.

## 2022-09-29 NOTE — Assessment & Plan Note (Signed)
Started on Augmentin and prednisone. Increase fluid intake. Use humidifier and steam. If symptoms are not improving call the office for further evaluation.

## 2022-10-08 ENCOUNTER — Encounter: Payer: Self-pay | Admitting: Family Medicine

## 2022-10-08 ENCOUNTER — Ambulatory Visit: Payer: BC Managed Care – PPO | Admitting: Family Medicine

## 2022-10-08 VITALS — BP 118/72 | HR 91 | Ht 70.0 in | Wt 318.0 lb

## 2022-10-08 DIAGNOSIS — I152 Hypertension secondary to endocrine disorders: Secondary | ICD-10-CM

## 2022-10-08 DIAGNOSIS — M5136 Other intervertebral disc degeneration, lumbar region: Secondary | ICD-10-CM

## 2022-10-08 DIAGNOSIS — Z6841 Body Mass Index (BMI) 40.0 and over, adult: Secondary | ICD-10-CM | POA: Diagnosis not present

## 2022-10-08 DIAGNOSIS — M4807 Spinal stenosis, lumbosacral region: Secondary | ICD-10-CM

## 2022-10-08 DIAGNOSIS — F1190 Opioid use, unspecified, uncomplicated: Secondary | ICD-10-CM

## 2022-10-08 DIAGNOSIS — G4733 Obstructive sleep apnea (adult) (pediatric): Secondary | ICD-10-CM

## 2022-10-08 DIAGNOSIS — I10 Essential (primary) hypertension: Secondary | ICD-10-CM

## 2022-10-08 DIAGNOSIS — E1159 Type 2 diabetes mellitus with other circulatory complications: Secondary | ICD-10-CM | POA: Diagnosis not present

## 2022-10-08 DIAGNOSIS — Z125 Encounter for screening for malignant neoplasm of prostate: Secondary | ICD-10-CM

## 2022-10-08 DIAGNOSIS — E118 Type 2 diabetes mellitus with unspecified complications: Secondary | ICD-10-CM

## 2022-10-08 DIAGNOSIS — E119 Type 2 diabetes mellitus without complications: Secondary | ICD-10-CM

## 2022-10-08 DIAGNOSIS — Z114 Encounter for screening for human immunodeficiency virus [HIV]: Secondary | ICD-10-CM

## 2022-10-08 DIAGNOSIS — Z1211 Encounter for screening for malignant neoplasm of colon: Secondary | ICD-10-CM

## 2022-10-08 DIAGNOSIS — E559 Vitamin D deficiency, unspecified: Secondary | ICD-10-CM

## 2022-10-08 DIAGNOSIS — E785 Hyperlipidemia, unspecified: Secondary | ICD-10-CM

## 2022-10-08 DIAGNOSIS — M5416 Radiculopathy, lumbar region: Secondary | ICD-10-CM

## 2022-10-08 MED ORDER — OZEMPIC (2 MG/DOSE) 8 MG/3ML ~~LOC~~ SOPN
PEN_INJECTOR | SUBCUTANEOUS | 11 refills | Status: DC
Start: 2022-10-08 — End: 2023-07-15

## 2022-10-08 NOTE — Progress Notes (Signed)
SUBJECTIVE:   Chief Complaint  Patient presents with   Establish Care   HPI Patient presents to clinic to transfer care.  No acute concerns today.  Hypertension Asymptomatic.  Currently taking Zestril 30 mg daily and tolerating well.  Denies any visual changes, headaches, chest pain, shortness of breath.  Does not check blood pressures at home.  Type 2 diabetes Asymptomatic.  Currently on Ozempic 2 mg weekly and tolerating medication well.  Weight has decreased 43 pounds since initiation of medication.  On ACEi.  Not currently on statin therapy.    Morbid obesity BMI has decreased from 51.88 to 45.63 over the past year since initiation of semaglutide.  Reports portion control at times.  No significant change in diet.  Has not initiated exercise plan.  Chronic low back pain. Had previously been referred to orthopedics.  Could not find any documentation of evaluation in epic or Care Everywhere.  Did have MRI lumbar spine 05/23 which showed mild stenosis at 2-3 and mild to moderate neural foraminal stenosis at L3-4, L4-5 and L5-1.  Disc bulging noted at L2-S1, with potential for L3 nerve root impingement.  Previously treated with meloxicam, Norco.  Now currently takes tramadol 50 mg twice daily as needed.  Has not had pain management referral previously.  Denies any urinary or bowel incontinence, fevers, no saddle anesthesia.  Hyperlipidemia Not currently on statin therapy.  Recent LDL not at goal less than 70.  10-year ASCVD intermediate risk mace 17.7%.  No previous cardiac CT completed.  Risk factors include male gender, age greater than 66, DM type II, hypertension would likely benefit from statin therapy.  PERTINENT PMH / PSH: Morbid obesity Hypertension Hyperlipidemia DM type II Spinal stenosis, lumbar region Chronic opioid use OSA on CPAP  OBJECTIVE:  BP 118/72   Pulse 91   Ht 5\' 10"  (1.778 m)   Wt (!) 318 lb (144.2 kg)   SpO2 97%   BMI 45.63 kg/m    Physical  Exam Vitals reviewed.  Constitutional:      General: He is not in acute distress.    Appearance: He is obese. He is not ill-appearing.  HENT:     Head: Normocephalic.  Eyes:     Conjunctiva/sclera: Conjunctivae normal.  Neck:     Thyroid: No thyromegaly or thyroid tenderness.  Cardiovascular:     Rate and Rhythm: Normal rate and regular rhythm.     Pulses: Normal pulses.  Pulmonary:     Effort: Pulmonary effort is normal.     Breath sounds: Normal breath sounds.  Abdominal:     General: Bowel sounds are normal.  Neurological:     Mental Status: He is alert. Mental status is at baseline.  Psychiatric:        Mood and Affect: Mood normal.        Behavior: Behavior normal.        Thought Content: Thought content normal.        Judgment: Judgment normal.       09/26/2022   11:50 AM 11/14/2021   11:10 AM 10/16/2021    2:21 PM 09/13/2021    3:33 PM 12/23/2019    4:09 PM  Depression screen PHQ 2/9  Decreased Interest 0 0 0 0 0  Down, Depressed, Hopeless 0 0 0 0 0  PHQ - 2 Score 0 0 0 0 0  Altered sleeping 0      Tired, decreased energy 0      Change in appetite 0  Feeling bad or failure about yourself  0      Trouble concentrating 0      Moving slowly or fidgety/restless 0      Suicidal thoughts 0      PHQ-9 Score 0      Difficult doing work/chores Not difficult at all           09/26/2022   11:51 AM 12/23/2019    4:09 PM  GAD 7 : Generalized Anxiety Score  Nervous, Anxious, on Edge 0 0  Control/stop worrying 0 0  Worry too much - different things 0 0  Trouble relaxing 0 0  Restless 0 0  Easily annoyed or irritable 0 0  Afraid - awful might happen 0 0  Total GAD 7 Score 0 0  Anxiety Difficulty Not difficult at all Not difficult at all      ASSESSMENT/PLAN:  Type 2 diabetes with complication [facility]) Assessment & Plan: Chronic.  Asymptomatic.  Recent A1c 7.3.  Currently on GLP-1 and tolerating well. Refill semaglutide 2 mg subcutaneous weekly Continue  ACEi Continue low-dose ASA Recommend statin therapy Check A1c/lipids/UACR.  Patient to return to clinic within the next week for blood work as not fasting today. Recommend annual eye exam.  Referral sent to ophthalmology Recommend annual foot exam.  Will complete at next visit   Orders: -     Hemoglobin A1c; Future -     Microalbumin / creatinine urine ratio; Future -     Ambulatory referral to Ophthalmology -     Ozempic (2 MG/DOSE); INJECT 2MG  SUBCUTANEOUSLY ONCE WEEKLY  Dispense: 3 mL; Refill: 11  Morbid obesity with BMI of 45.0-49.9, adult (HCC) Assessment & Plan: Chronic.  BMI decreased by 6.25 since starting GLP-1. Continue Ozempic Encouraged healthy diet and increase activity as tolerated Suspect increase load contributing to back pain and would benefit from activity and weight loss  Orders: -     Lipid panel; Future -     TSH; Future -     Vitamin B12; Future  Hypertension associated with diabetes (HCC) Assessment & Plan: Chronic.  Well-controlled on current medication. Continue lisinopril 30 mg daily Check c-Met Monitor blood pressure at home, goal less than 130/80  Orders: -     Comprehensive metabolic panel; Future -     TSH; Future  Diabetic eye exam [facility]) -     Ambulatory referral to Ophthalmology  Colon cancer screening -     Ambulatory referral to Gastroenterology  Encounter for screening for HIV -     HIV Antibody (routine testing w rflx); Future  Chronic, continuous use of opioids -     ToxASSURE Select 13 (MW), Urine  OSA on CPAP Assessment & Plan: Chronic.  Compliant with CPAP.    Bulging lumbar disc Assessment & Plan: Chronic.  MRI 10/2021 showing mild stenosis at 2-3 and mild to moderate neural foraminal stenosis at L3-4, L4-5 and L5-1.  Disc bulging noted at L2-S1, with potential for L3 nerve root impingement.  Was previously seen at [facility] with recommendations for prednisone.  No indication for surgical treatment. Tramadol was  initiated by prior PCP.  Does not require refill at this time. PDMP reviewed Encouraged weight loss Offered pain management referral, politely declined Obtain UDS Will need opioid contract signed prior to refill    Lumbar radiculopathy Assessment & Plan: Chronic.  Possible potential for L3 nerve root impingement noted on MRI from 10/2021.  No red flags. Consider neurosurgical evaluation if worsening symptoms  Hyperlipidemia, unspecified hyperlipidemia type Assessment & Plan: Chronic.  LDL not currently at goal less than 70 10-year ASCVD intermediate risk mace 17.7% Not only on statin therapy Repeat lipids fasting Would recommend statin therapy given risk factors, gender, age greater than 58, hypertension, DM type II, morbid obesity. Could consider calcium score at next visit.  Orders: -     Lipid panel; Future  Spinal stenosis of lumbosacral region Assessment & Plan: Chronic.  MRI lumbar spine 05/23 which showed mild stenosis at 2-3 and mild to moderate neural foraminal stenosis at L3-4, L4-5 and L5-1.  Disc bulging noted at L2-S1, with potential for L3 nerve root impingement.  No red flags. Consider neurosurgical evaluation, plan to discuss with patient at next visit Encourage weight loss    Vitamin D deficiency Assessment & Plan: Chronic. Check vitamin D levels  Orders: -     VITAMIN D 25 Hydroxy (Vit-D Deficiency, Fractures); Future  Prostate cancer screening -     PSA; Future  HCM Diabetic foot due.  Will complete at next visit Recommend annual eye exam.  Referral to ophthalmology sent HIV screening labs entered Hepatitis C screening complete Screening for prostate cancer with labs. Last colonoscopy in 2013 or 2015.  Was completed and [place].  Do not have results.  Per note from 04/2018 was due in 2020.  Will send referral for colon cancer screening Tetanus up-to-date Recommend shingles vaccine Recent PHQ-9/GAD screening completed 09/26/22   PDMP  reviewed  Return in about 6 months (around 04/10/2023) for PCP.  Total of 40 minutes spent with patient, greater than 50% of time spent face to face on counseling and coordination of care, specifically nutrition, opioid use related, activity increase.     Dana Allan, MD

## 2022-10-08 NOTE — Patient Instructions (Addendum)
It was a pleasure meeting you today. Thank you for allowing me to take part in your health care.  Our goals for today as we discussed include:  Schedule appointment for fasting blood work  Foot exam at next visit  Recommend Shingles vaccine.  This is a 2 dose series and can be given at your local pharmacy.  Please talk to your pharmacist about this.   Follow up in 6 months   If you have any questions or concerns, please do not hesitate to call the office at [phone].  I look forward to our next visit and until then take care and stay safe.  Regards,   Dana Allan, MD   [facility]

## 2022-10-17 ENCOUNTER — Encounter: Payer: Self-pay | Admitting: *Deleted

## 2022-10-18 ENCOUNTER — Telehealth: Payer: Self-pay

## 2022-10-18 DIAGNOSIS — J309 Allergic rhinitis, unspecified: Secondary | ICD-10-CM

## 2022-10-18 MED ORDER — LORATADINE 10 MG PO TABS
ORAL_TABLET | ORAL | 0 refills | Status: DC
Start: 2022-10-18 — End: 2023-01-14

## 2022-10-18 NOTE — Telephone Encounter (Signed)
Rx sent in to pharmacy, pt is aware

## 2022-10-18 NOTE — Telephone Encounter (Signed)
Prescription Request  10/18/2022  LOV: Visit date not found  What is the name of the medication or equipment?  loratadine (CLARITIN) 10 MG tablet   Have you contacted your pharmacy to request a refill? Yes   Which pharmacy would you like this sent to?  [facility] [address] [place] - [address] [place] [address] Phone: [phone] Fax: [phone]    Patient notified that their request is being sent to the clinical staff for review and that they should receive a response within 2 business days.   Please advise at Mobile [phone] (mobile)   Patient states his pharmacy told him that we need to call in his loratadine (CLARITIN) 10 MG tablet without using the name Claritin because his insurance company states they are seeing it as allergy medicine and they won't cover it.  Patient states they have been covering this medication just three months ago but it was called in as loratadine 10 MG tab RIS generic (quantity 90)  Patient states he has about three days of this medication left.

## 2022-10-24 ENCOUNTER — Telehealth: Payer: Self-pay | Admitting: Family Medicine

## 2022-10-24 ENCOUNTER — Encounter: Payer: Self-pay | Admitting: Family Medicine

## 2022-10-24 DIAGNOSIS — E119 Type 2 diabetes mellitus without complications: Secondary | ICD-10-CM | POA: Insufficient documentation

## 2022-10-24 DIAGNOSIS — Z1211 Encounter for screening for malignant neoplasm of colon: Secondary | ICD-10-CM | POA: Insufficient documentation

## 2022-10-24 DIAGNOSIS — Z1159 Encounter for screening for other viral diseases: Secondary | ICD-10-CM | POA: Insufficient documentation

## 2022-10-24 DIAGNOSIS — F1190 Opioid use, unspecified, uncomplicated: Secondary | ICD-10-CM | POA: Insufficient documentation

## 2022-10-24 DIAGNOSIS — Z114 Encounter for screening for human immunodeficiency virus [HIV]: Secondary | ICD-10-CM | POA: Insufficient documentation

## 2022-10-24 DIAGNOSIS — Z125 Encounter for screening for malignant neoplasm of prostate: Secondary | ICD-10-CM | POA: Insufficient documentation

## 2022-10-24 NOTE — Assessment & Plan Note (Signed)
Chronic.  LDL not currently at goal less than 70 10-year ASCVD intermediate risk mace 17.7% Not only on statin therapy Repeat lipids fasting Would recommend statin therapy given risk factors, gender, age greater than 23, hypertension, DM type II, morbid obesity. Could consider calcium score at next visit.

## 2022-10-24 NOTE — Assessment & Plan Note (Signed)
Chronic.  MRI lumbar spine 05/23 which showed mild stenosis at 2-3 and mild to moderate neural foraminal stenosis at L3-4, L4-5 and L5-1.  Disc bulging noted at L2-S1, with potential for L3 nerve root impingement.  No red flags. Consider neurosurgical evaluation, plan to discuss with patient at next visit Encourage weight loss

## 2022-10-24 NOTE — Assessment & Plan Note (Addendum)
Chronic.  Possible potential for L3 nerve root impingement noted on MRI from 10/2021.  No red flags. Consider neurosurgical evaluation if worsening symptoms

## 2022-10-24 NOTE — Assessment & Plan Note (Addendum)
Chronic.  Asymptomatic.  Recent A1c 7.3.  Currently on GLP-1 and tolerating well. Refill semaglutide 2 mg subcutaneous weekly Continue ACEi Continue low-dose ASA Recommend statin therapy Check A1c/lipids/UACR.  Patient to return to clinic within the next week for blood work as not fasting today. Recommend annual eye exam.  Referral sent to ophthalmology Recommend annual foot exam.  Will complete at next visit

## 2022-10-24 NOTE — Assessment & Plan Note (Signed)
Chronic. Check vitamin D levels 

## 2022-10-24 NOTE — Assessment & Plan Note (Signed)
Chronic.  Well-controlled on current medication. Continue lisinopril 30 mg daily Check c-Met Monitor blood pressure at home, goal less than 130/80

## 2022-10-24 NOTE — Assessment & Plan Note (Signed)
Chronic.  MRI 10/2021 showing mild stenosis at 2-3 and mild to moderate neural foraminal stenosis at L3-4, L4-5 and L5-1.  Disc bulging noted at L2-S1, with potential for L3 nerve root impingement.  Was previously seen at [facility] with recommendations for prednisone.  No indication for surgical treatment. Tramadol was initiated by prior PCP.  Does not require refill at this time. PDMP reviewed Encouraged weight loss Offered pain management referral, politely declined Obtain UDS Will need opioid contract signed prior to refill

## 2022-10-24 NOTE — Assessment & Plan Note (Signed)
Chronic.  BMI decreased by 6.25 since starting GLP-1. Continue Ozempic Encouraged healthy diet and increase activity as tolerated Suspect increase load contributing to back pain and would benefit from activity and weight loss

## 2022-10-24 NOTE — Assessment & Plan Note (Signed)
Chronic.  Compliant with CPAP.

## 2022-10-25 NOTE — Addendum Note (Signed)
Addended by: Enid Cutter on: 10/25/2022 07:10 PM   Modules accepted: Orders

## 2022-10-25 NOTE — Addendum Note (Signed)
Addended by: Warden Fillers on: 10/25/2022 11:08 AM   Modules accepted: Orders

## 2022-10-25 NOTE — Telephone Encounter (Signed)
I called pt & he states that his work schedule is very busy. I tried to schedule a fasting lab appt but he states that he will call back next week.  Also, pt states that he is scheduled for a colonoscopy & wants to know if Cologuard is an option for him due to his busy work schedule.

## 2022-11-11 ENCOUNTER — Other Ambulatory Visit: Payer: Self-pay | Admitting: Family Medicine

## 2022-11-11 DIAGNOSIS — M4807 Spinal stenosis, lumbosacral region: Secondary | ICD-10-CM

## 2022-11-11 DIAGNOSIS — M47816 Spondylosis without myelopathy or radiculopathy, lumbar region: Secondary | ICD-10-CM

## 2022-11-11 DIAGNOSIS — G8929 Other chronic pain: Secondary | ICD-10-CM

## 2022-11-11 DIAGNOSIS — M5136 Other intervertebral disc degeneration, lumbar region: Secondary | ICD-10-CM

## 2022-11-12 ENCOUNTER — Other Ambulatory Visit: Payer: Self-pay | Admitting: Family Medicine

## 2022-11-12 DIAGNOSIS — G8929 Other chronic pain: Secondary | ICD-10-CM

## 2022-11-12 DIAGNOSIS — M545 Low back pain, unspecified: Secondary | ICD-10-CM

## 2022-11-12 DIAGNOSIS — M51369 Other intervertebral disc degeneration, lumbar region without mention of lumbar back pain or lower extremity pain: Secondary | ICD-10-CM

## 2022-11-12 DIAGNOSIS — M47816 Spondylosis without myelopathy or radiculopathy, lumbar region: Secondary | ICD-10-CM

## 2022-11-12 DIAGNOSIS — M4807 Spinal stenosis, lumbosacral region: Secondary | ICD-10-CM

## 2022-11-12 DIAGNOSIS — M5136 Other intervertebral disc degeneration, lumbar region: Secondary | ICD-10-CM

## 2022-11-12 MED ORDER — TRAMADOL HCL 50 MG PO TABS
50.0000 mg | ORAL_TABLET | Freq: Two times a day (BID) | ORAL | 0 refills | Status: DC | PRN
Start: 2022-11-12 — End: 2023-01-15

## 2022-11-17 DIAGNOSIS — Z1211 Encounter for screening for malignant neoplasm of colon: Secondary | ICD-10-CM | POA: Diagnosis not present

## 2022-11-23 LAB — COLOGUARD: COLOGUARD: NEGATIVE

## 2022-11-24 ENCOUNTER — Encounter: Payer: Self-pay | Admitting: Family Medicine

## 2023-01-02 ENCOUNTER — Telehealth: Payer: Self-pay | Admitting: Family

## 2023-01-07 ENCOUNTER — Other Ambulatory Visit: Payer: Self-pay

## 2023-01-07 MED ORDER — METHOCARBAMOL 500 MG PO TABS: 500.0000 mg | ORAL_TABLET | Freq: Two times a day (BID) | ORAL | 0 refills | Status: DC

## 2023-01-07 NOTE — Telephone Encounter (Signed)
Prescription Request  01/07/2023  LOV: Visit date not found  What is the name of the medication or equipment? methocarbamol (ROBAXIN) 500 MG tablet. Patient is out of medication   Have you contacted your pharmacy to request a refill? Yes   Which pharmacy would you like this sent to?   [facility] [address], [place] - [address] [place] [address] Phone: [phone] Fax: [phone]    Patient notified that their request is being sent to the clinical staff for review and that they should receive a response within 2 business days.   Please advise at Mobile [phone] (mobile)

## 2023-01-07 NOTE — Telephone Encounter (Signed)
LOV 10/08/2022 NOV Return in about 6 months (around 04/10/2023)

## 2023-01-13 ENCOUNTER — Other Ambulatory Visit: Payer: Self-pay | Admitting: Family Medicine

## 2023-01-13 DIAGNOSIS — M4807 Spinal stenosis, lumbosacral region: Secondary | ICD-10-CM

## 2023-01-13 DIAGNOSIS — M545 Low back pain, unspecified: Secondary | ICD-10-CM

## 2023-01-13 DIAGNOSIS — M51369 Other intervertebral disc degeneration, lumbar region without mention of lumbar back pain or lower extremity pain: Secondary | ICD-10-CM

## 2023-01-13 DIAGNOSIS — M5136 Other intervertebral disc degeneration, lumbar region: Secondary | ICD-10-CM

## 2023-01-13 DIAGNOSIS — M47816 Spondylosis without myelopathy or radiculopathy, lumbar region: Secondary | ICD-10-CM

## 2023-01-14 ENCOUNTER — Telehealth: Payer: Self-pay | Admitting: Family Medicine

## 2023-01-14 ENCOUNTER — Other Ambulatory Visit: Payer: Self-pay

## 2023-01-14 DIAGNOSIS — J309 Allergic rhinitis, unspecified: Secondary | ICD-10-CM

## 2023-01-14 MED ORDER — LORATADINE 10 MG PO TABS
ORAL_TABLET | ORAL | 0 refills | Status: DC
Start: 2023-01-14 — End: 2023-04-14

## 2023-01-14 NOTE — Telephone Encounter (Signed)
Patient called and needs a refill on his prescription the name is loratadine loratadine  10 MG table Generic 90 pill supply. The pharmacy is [facility] [address], [place] - [address] [place] [address] Phone: [phone]  Fax: [phone]

## 2023-01-14 NOTE — Telephone Encounter (Signed)
RX refill sent to pharmacy.

## 2023-03-17 ENCOUNTER — Telehealth: Payer: Self-pay | Admitting: Family Medicine

## 2023-03-17 NOTE — Telephone Encounter (Signed)
Patient would like a med refill on  traMADol (ULTRAM) 50 MG tablet please send to [facility] .

## 2023-03-18 ENCOUNTER — Other Ambulatory Visit: Payer: Self-pay

## 2023-03-18 DIAGNOSIS — M4807 Spinal stenosis, lumbosacral region: Secondary | ICD-10-CM

## 2023-03-18 DIAGNOSIS — M47816 Spondylosis without myelopathy or radiculopathy, lumbar region: Secondary | ICD-10-CM

## 2023-03-18 DIAGNOSIS — G8929 Other chronic pain: Secondary | ICD-10-CM

## 2023-03-18 DIAGNOSIS — M51369 Other intervertebral disc degeneration, lumbar region without mention of lumbar back pain or lower extremity pain: Secondary | ICD-10-CM

## 2023-03-18 DIAGNOSIS — M545 Low back pain, unspecified: Secondary | ICD-10-CM

## 2023-03-18 NOTE — Telephone Encounter (Signed)
Rx request sent to provider

## 2023-03-19 ENCOUNTER — Other Ambulatory Visit: Payer: Self-pay | Admitting: Family Medicine

## 2023-03-19 DIAGNOSIS — M51369 Other intervertebral disc degeneration, lumbar region without mention of lumbar back pain or lower extremity pain: Secondary | ICD-10-CM

## 2023-03-19 DIAGNOSIS — M47816 Spondylosis without myelopathy or radiculopathy, lumbar region: Secondary | ICD-10-CM

## 2023-03-19 DIAGNOSIS — G8929 Other chronic pain: Secondary | ICD-10-CM

## 2023-03-19 DIAGNOSIS — M4807 Spinal stenosis, lumbosacral region: Secondary | ICD-10-CM

## 2023-03-19 MED ORDER — TRAMADOL HCL 50 MG PO TABS
50.0000 mg | ORAL_TABLET | Freq: Two times a day (BID) | ORAL | 0 refills | Status: DC | PRN
Start: 2023-03-19 — End: 2023-04-14

## 2023-03-19 NOTE — Telephone Encounter (Signed)
Patient has called to check on refill of Tramadol.

## 2023-03-31 ENCOUNTER — Other Ambulatory Visit (INDEPENDENT_AMBULATORY_CARE_PROVIDER_SITE_OTHER): Payer: BC Managed Care – PPO

## 2023-03-31 DIAGNOSIS — Z6841 Body Mass Index (BMI) 40.0 and over, adult: Secondary | ICD-10-CM | POA: Diagnosis not present

## 2023-03-31 DIAGNOSIS — E1159 Type 2 diabetes mellitus with other circulatory complications: Secondary | ICD-10-CM | POA: Diagnosis not present

## 2023-03-31 DIAGNOSIS — E785 Hyperlipidemia, unspecified: Secondary | ICD-10-CM

## 2023-03-31 DIAGNOSIS — Z114 Encounter for screening for human immunodeficiency virus [HIV]: Secondary | ICD-10-CM

## 2023-03-31 DIAGNOSIS — F1190 Opioid use, unspecified, uncomplicated: Secondary | ICD-10-CM

## 2023-03-31 DIAGNOSIS — I152 Hypertension secondary to endocrine disorders: Secondary | ICD-10-CM

## 2023-03-31 DIAGNOSIS — E118 Type 2 diabetes mellitus with unspecified complications: Secondary | ICD-10-CM | POA: Diagnosis not present

## 2023-03-31 DIAGNOSIS — Z125 Encounter for screening for malignant neoplasm of prostate: Secondary | ICD-10-CM

## 2023-03-31 DIAGNOSIS — E559 Vitamin D deficiency, unspecified: Secondary | ICD-10-CM

## 2023-03-31 LAB — COMPREHENSIVE METABOLIC PANEL
ALT: 25 U/L (ref 0–53)
AST: 17 U/L (ref 0–37)
Albumin: 4.3 g/dL (ref 3.5–5.2)
Alkaline Phosphatase: 47 U/L (ref 39–117)
BUN: 22 mg/dL (ref 6–23)
CO2: 28 meq/L (ref 19–32)
Calcium: 9.9 mg/dL (ref 8.4–10.5)
Chloride: 103 meq/L (ref 96–112)
Creatinine, Ser: 1.07 mg/dL (ref 0.40–1.50)
GFR: 74.42 mL/min (ref >60.00)
Glucose, Bld: 103 mg/dL — ABNORMAL HIGH (ref 70–99)
Potassium: 4.7 meq/L (ref 3.5–5.1)
Sodium: 139 meq/L (ref 135–145)
Total Bilirubin: 0.5 mg/dL (ref 0.2–1.2)
Total Protein: 6.7 g/dL (ref 6.0–8.3)

## 2023-03-31 LAB — TSH: TSH: 1.67 u[IU]/mL (ref 0.35–5.50)

## 2023-03-31 LAB — VITAMIN D 25 HYDROXY (VIT D DEFICIENCY, FRACTURES): VITD: 56.1 ng/mL (ref 30.00–100.00)

## 2023-03-31 LAB — HEMOGLOBIN A1C: Hgb A1c MFr Bld: 6 % (ref 4.6–6.5)

## 2023-03-31 LAB — VITAMIN B12: Vitamin B-12: 241 pg/mL (ref 211–911)

## 2023-03-31 LAB — LIPID PANEL
Cholesterol: 148 mg/dL (ref 0–200)
HDL: 37.7 mg/dL — ABNORMAL LOW (ref >39.00)
LDL Cholesterol: 94 mg/dL (ref 0–99)
NonHDL: 110.68
Total CHOL/HDL Ratio: 4
Triglycerides: 85 mg/dL (ref 0.0–149.0)
VLDL: 17 mg/dL (ref 0.0–40.0)

## 2023-03-31 LAB — MICROALBUMIN / CREATININE URINE RATIO
Creatinine,U: 112.8 mg/dL
Microalb Creat Ratio: 0.6 mg/g (ref 0.0–30.0)
Microalb, Ur: <0.7 mg/dL (ref 0.0–1.9)

## 2023-03-31 LAB — PSA: PSA: 1.51 ng/mL (ref 0.10–4.00)

## 2023-04-01 LAB — HIV ANTIBODY (ROUTINE TESTING W REFLEX): HIV 1&2 Ab, 4th Generation: NONREACTIVE

## 2023-04-05 LAB — TOXASSURE SELECT 13 (MW), URINE

## 2023-04-06 ENCOUNTER — Encounter: Payer: Self-pay | Admitting: Family Medicine

## 2023-04-14 ENCOUNTER — Encounter: Payer: Self-pay | Admitting: Family Medicine

## 2023-04-14 ENCOUNTER — Ambulatory Visit: Payer: BC Managed Care – PPO | Admitting: Family Medicine

## 2023-04-14 VITALS — BP 118/70 | HR 81 | Temp 98.2°F | Resp 16 | Ht 70.0 in | Wt 306.5 lb

## 2023-04-14 DIAGNOSIS — J309 Allergic rhinitis, unspecified: Secondary | ICD-10-CM | POA: Diagnosis not present

## 2023-04-14 DIAGNOSIS — E1159 Type 2 diabetes mellitus with other circulatory complications: Secondary | ICD-10-CM

## 2023-04-14 DIAGNOSIS — M51369 Other intervertebral disc degeneration, lumbar region without mention of lumbar back pain or lower extremity pain: Secondary | ICD-10-CM

## 2023-04-14 DIAGNOSIS — M545 Low back pain, unspecified: Secondary | ICD-10-CM

## 2023-04-14 DIAGNOSIS — E1169 Type 2 diabetes mellitus with other specified complication: Secondary | ICD-10-CM | POA: Diagnosis not present

## 2023-04-14 DIAGNOSIS — I152 Hypertension secondary to endocrine disorders: Secondary | ICD-10-CM

## 2023-04-14 DIAGNOSIS — M4807 Spinal stenosis, lumbosacral region: Secondary | ICD-10-CM

## 2023-04-14 DIAGNOSIS — E118 Type 2 diabetes mellitus with unspecified complications: Secondary | ICD-10-CM

## 2023-04-14 DIAGNOSIS — E785 Hyperlipidemia, unspecified: Secondary | ICD-10-CM

## 2023-04-14 DIAGNOSIS — Z7985 Long-term (current) use of injectable non-insulin antidiabetic drugs: Secondary | ICD-10-CM

## 2023-04-14 DIAGNOSIS — G8929 Other chronic pain: Secondary | ICD-10-CM

## 2023-04-14 DIAGNOSIS — M47816 Spondylosis without myelopathy or radiculopathy, lumbar region: Secondary | ICD-10-CM

## 2023-04-14 MED ORDER — LORATADINE 10 MG PO TABS: ORAL_TABLET | ORAL | 0 refills | Status: DC

## 2023-04-14 MED ORDER — TRAMADOL HCL 50 MG PO TABS: 50.0000 mg | ORAL_TABLET | Freq: Two times a day (BID) | ORAL | 0 refills | Status: AC | PRN

## 2023-04-14 MED ORDER — ROSUVASTATIN CALCIUM 10 MG PO TABS: 10.0000 mg | ORAL_TABLET | Freq: Every day | ORAL | 3 refills | Status: DC

## 2023-04-14 NOTE — Patient Instructions (Signed)
It was a pleasure meeting you today. Thank you for allowing me to take part in your health care.  Our goals for today as we discussed include:  Refills sent for requested medications   This is a list of the screening recommended for you and due dates:  Health Maintenance  Topic Date Due   Complete foot exam   Never done   Eye exam for diabetics  Never done   Zoster (Shingles) Vaccine (1 of 2) Never done   COVID-19 Vaccine (3 - 2023-24 season) 02/02/2023   Colon Cancer Screening  06/04/2023   Hemoglobin A1C  09/29/2023   Yearly kidney function blood test for diabetes  03/30/2024   Yearly kidney health urinalysis for diabetes  03/30/2024   DTaP/Tdap/Td vaccine (2 - Td or Tdap) 09/14/2031   Flu Shot  Completed   Hepatitis C Screening  Completed   HIV Screening  Completed   HPV Vaccine  Aged Out     Follow up in 6 months   If you have any questions or concerns, please do not hesitate to call the office at [phone].  I look forward to our next visit and until then take care and stay safe.  Regards,   Dana Allan, MD   [facility]

## 2023-04-14 NOTE — Progress Notes (Unsigned)
SUBJECTIVE:   Chief Complaint  Patient presents with   Diabetes   HPI Presents for diabetes management  Discussed the use of AI scribe software for clinical note transcription with the patient, who gave verbal consent to proceed.  History of Present Illness The patient, a known diabetic, was brought in for a follow-up consultation due to lab results indicating a need for further management. The patient's HbA1c levels have significantly improved, currently at 6.0, and he is on Ozempic for diabetes management. The patient reported satisfaction with the current refill schedule for Ozempic and expressed no need for a refill at this time.  The patient's urine test results were discussed, revealing the presence of tramadol as expected. The patient requested a refill for tramadol, which was agreed upon. The patient also mentioned a need for a refill of loratadine.  The patient's cholesterol levels were a point of concern, with the patient's LDL cholesterol at 94, higher than the recommended level for diabetics. The patient is not currently on any cholesterol medication. The patient's diet was discussed, with the patient admitting to occasional fast food intake and a significant amount of cheese in his diet.  The patient's vitamin B12 levels were also discussed. The patient admitted to irregular intake of vitamin B12 supplements, which could explain the slightly lower than normal levels. The patient was advised to increase his intake to once a week.  The patient also reported a history of back pain, which varies in intensity from day to day. 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 The patient is currently managing his back pain with tramadol.  The patient also mentioned a history of sleep apnea, which was managed with the use of a machine. The patient reported significant improvement in his condition since starting the sleep apnea treatment,  including a resolution of previous lower leg inflammation and leakage.  The patient is considering a change in primary care providers, which may affect his current medication management. The patient is also planning a cruise trip in the near future, which may temporarily disrupt his current diet and medication regimen.    PERTINENT PMH / PSH: As above  OBJECTIVE:  BP 118/70   Pulse 81   Temp 98.2 F (36.8 C)   Resp 16   Ht 5\' 10"  (1.778 m)   Wt (!) 306 lb 8 oz (139 kg)   SpO2 100%   BMI 43.98 kg/m    Physical Exam Vitals reviewed.  Constitutional:      General: He is not in acute distress.    Appearance: Normal appearance. He is obese. He is not ill-appearing, toxic-appearing or diaphoretic.  Eyes:     General:        Right eye: No discharge.        Left eye: No discharge.  Cardiovascular:     Rate and Rhythm: Normal rate and regular rhythm.     Heart sounds: Normal heart sounds.  Pulmonary:     Effort: Pulmonary effort is normal.     Breath sounds: Normal breath sounds.  Abdominal:     General: Bowel sounds are normal.  Musculoskeletal:        General: Normal range of motion.     Cervical back: Normal range of motion.  Skin:    General: Skin is warm and dry.  Neurological:     Mental Status: He is alert and oriented to person, place, and time. Mental status  is at baseline.  Psychiatric:        Mood and Affect: Mood normal.        Behavior: Behavior normal.        Thought Content: Thought content normal.        Judgment: Judgment normal.        04/14/2023    4:00 PM 09/26/2022   11:50 AM 11/14/2021   11:10 AM 10/16/2021    2:21 PM 09/13/2021    3:33 PM  Depression screen PHQ 2/9  Decreased Interest 0 0 0 0 0  Down, Depressed, Hopeless 0 0 0 0 0  PHQ - 2 Score 0 0 0 0 0  Altered sleeping 0 0     Tired, decreased energy 0 0     Change in appetite 0 0     Feeling bad or failure about yourself  0 0     Trouble concentrating 0 0     Moving slowly or  fidgety/restless 0 0     Suicidal thoughts 0 0     PHQ-9 Score 0 0     Difficult doing work/chores Not difficult at all Not difficult at all         04/14/2023    4:00 PM 09/26/2022   11:51 AM 12/23/2019    4:09 PM  GAD 7 : Generalized Anxiety Score  Nervous, Anxious, on Edge 0 0 0  Control/stop worrying 0 0 0  Worry too much - different things 0 0 0  Trouble relaxing 0 0 0  Restless 0 0 0  Easily annoyed or irritable 0 0 0  Afraid - awful might happen 0 0 0  Total GAD 7 Score 0 0 0  Anxiety Difficulty Not difficult at all Not difficult at all Not difficult at all    ASSESSMENT/PLAN:  Type 2 diabetes with complication [facility]) Assessment & Plan: Improved control with A1c of 6.0. Currently on Ozempic. -Continue Ozempic. No changes to medication at this time.   Hyperlipidemia associated with type 2 diabetes mellitus (HCC) Assessment & Plan: LDL cholesterol slightly elevated at 94. Discussed the benefits of statin therapy in diabetics to reduce the risk of heart attack and stroke. -Start Crestor 10mg  daily. Monitor for muscle pains, joint pains, muscle weakness or aches. -Encourage diet modification and exercise to increase HDL and decrease LDL cholesterol.  Orders: -     Rosuvastatin Calcium; Take 1 tablet (10 mg total) by mouth daily.  Dispense: 90 tablet; Refill: 3  Hypertension associated with diabetes (HCC) Assessment & Plan: Chronic.  Well-controlled on current medication. Continue lisinopril 30 mg daily Check c-Met Monitor blood pressure at home, goal less than 130/80   Allergic rhinitis, unspecified seasonality, unspecified trigger -     Loratadine; TAKE 1 TABLET BY MOUTH ONCE DAILY AS NEEDED FOR ALLERGIES  Dispense: 90 tablet; Refill: 0  Arthritis of lumbar spine -     traMADol HCl; Take 1 tablet (50 mg total) by mouth 2 (two) times daily as needed.  Dispense: 60 tablet; Refill: 0  Bulging lumbar disc -     traMADol HCl; Take 1 tablet (50 mg total) by mouth 2  (two) times daily as needed.  Dispense: 60 tablet; Refill: 0  Spinal stenosis of lumbosacral region -     traMADol HCl; Take 1 tablet (50 mg total) by mouth 2 (two) times daily as needed.  Dispense: 60 tablet; Refill: 0  Chronic midline low back pain without sciatica Assessment & Plan: Patient on Tramadol  50mg  twice daily for chronic pain management. -Continue Tramadol 50mg  twice daily. Refill prescription. -UDS and opioid substance contract completed  Orders: -     traMADol HCl; Take 1 tablet (50 mg total) by mouth 2 (two) times daily as needed.  Dispense: 60 tablet; Refill: 0    General Health Maintenance -Continue annual eye exams. -Consider Pneumonia vaccine due to increased risk with diabetes. -Continue use of compression stockings for leg comfort and energy during long drives. -Follow-up in 6 months.    PDMP reviewed  Return in about 6 months (around 10/12/2023), or if symptoms worsen or fail to improve, for PCP.  Dana Allan, MD

## 2023-04-17 ENCOUNTER — Encounter: Payer: Self-pay | Admitting: Family Medicine

## 2023-04-17 DIAGNOSIS — E1169 Type 2 diabetes mellitus with other specified complication: Secondary | ICD-10-CM | POA: Insufficient documentation

## 2023-04-17 NOTE — Assessment & Plan Note (Signed)
LDL cholesterol slightly elevated at 94. Discussed the benefits of statin therapy in diabetics to reduce the risk of heart attack and stroke. -Start Crestor 10mg  daily. Monitor for muscle pains, joint pains, muscle weakness or aches. -Encourage diet modification and exercise to increase HDL and decrease LDL cholesterol.

## 2023-04-17 NOTE — Assessment & Plan Note (Signed)
Improved control with A1c of 6.0. Currently on Ozempic. -Continue Ozempic. No changes to medication at this time.

## 2023-04-17 NOTE — Assessment & Plan Note (Signed)
Chronic.  Well-controlled on current medication. Continue lisinopril 30 mg daily Check c-Met Monitor blood pressure at home, goal less than 130/80

## 2023-04-17 NOTE — Assessment & Plan Note (Signed)
Patient on Tramadol 50mg  twice daily for chronic pain management. -Continue Tramadol 50mg  twice daily. Refill prescription. -UDS and opioid substance contract completed

## 2023-05-05 ENCOUNTER — Other Ambulatory Visit: Payer: Self-pay | Admitting: Family Medicine

## 2023-05-06 ENCOUNTER — Ambulatory Visit: Payer: BC Managed Care – PPO | Admitting: Family Medicine

## 2023-05-07 ENCOUNTER — Encounter: Payer: Self-pay | Admitting: Family Medicine

## 2023-05-07 ENCOUNTER — Ambulatory Visit: Payer: BC Managed Care – PPO | Admitting: Family Medicine

## 2023-05-07 VITALS — BP 133/81 | HR 78 | Ht 70.0 in | Wt 316.0 lb

## 2023-05-07 DIAGNOSIS — E559 Vitamin D deficiency, unspecified: Secondary | ICD-10-CM | POA: Diagnosis not present

## 2023-05-07 DIAGNOSIS — J309 Allergic rhinitis, unspecified: Secondary | ICD-10-CM

## 2023-05-07 DIAGNOSIS — M5416 Radiculopathy, lumbar region: Secondary | ICD-10-CM

## 2023-05-07 DIAGNOSIS — G4733 Obstructive sleep apnea (adult) (pediatric): Secondary | ICD-10-CM

## 2023-05-07 DIAGNOSIS — E1159 Type 2 diabetes mellitus with other circulatory complications: Secondary | ICD-10-CM | POA: Diagnosis not present

## 2023-05-07 DIAGNOSIS — M4807 Spinal stenosis, lumbosacral region: Secondary | ICD-10-CM

## 2023-05-07 DIAGNOSIS — Z7689 Persons encountering health services in other specified circumstances: Secondary | ICD-10-CM

## 2023-05-07 DIAGNOSIS — I152 Hypertension secondary to endocrine disorders: Secondary | ICD-10-CM

## 2023-05-07 DIAGNOSIS — E1169 Type 2 diabetes mellitus with other specified complication: Secondary | ICD-10-CM

## 2023-05-07 DIAGNOSIS — E785 Hyperlipidemia, unspecified: Secondary | ICD-10-CM

## 2023-05-07 MED ORDER — LORATADINE 10 MG PO TABS: ORAL_TABLET | ORAL | 3 refills | Status: DC

## 2023-05-07 NOTE — Assessment & Plan Note (Signed)
Compliant with CPAP 

## 2023-05-07 NOTE — Patient Instructions (Addendum)
Vitamin B12 - take 500 mcg daily Vitamin D - take 5000 units daily - we will recheck at 6 months    Check your blood pressure twice weekly, and any time you have concerning symptoms like headache, chest pain, dizziness, shortness of breath, or vision changes.   Our goal is less than 130/80.  To appropriately check your blood pressure, make sure you do the following:  1) Avoid caffeine, exercise, or tobacco products for 30 minutes before checking. Empty your bladder. 2) Sit with your back supported in a flat-backed chair. Rest your arm on something flat (arm of the chair, table, etc). 3) Sit still with your feet flat on the floor, resting, for at least 5 minutes.  4) Check your blood pressure. Take 1-2 readings.  5) Write down these readings and bring with you to any provider appointments.  Bring your home blood pressure machine with you to a provider's office for accuracy comparison at least once a year.   Make sure you take your blood pressure medications before you come to any office visit, even if you were asked to fast for labs.

## 2023-05-07 NOTE — Progress Notes (Signed)
New patient visit   Patient: John Orozco   DOB: 1961/04/22   62 y.o. Male  MRN: [id number] Visit Date: 05/07/2023  Today's healthcare provider: Sherlyn Hay, DO   Chief Complaint  Patient presents with   New Patient (Initial Visit)   Subjective    John Orozco is a 62 y.o. male who presents today as a new patient to establish care.  HPI HPI   Pt stated--left 3rd finger hand-bandage cause rash, discoloration, bubble on the skin--4 days. Last edited by Shelly Bombard, CMA on 05/07/2023  3:40 PM.      The patient, a known diabetic on Ozempic, presented with a concern about a cut on his finger that had developed blisters. The cut, which occurred approximately ten days prior, was initially managed at home. However, after running out of his usual bandages and using a different brand, the patient noticed the development of blisters. These blisters, present for at least four days, were initially large and filled with fluid, but are now resolving as the patient popped the blisters. The patient reported no pain or discomfort, but did note a persistent itchiness.  In addition to the cut, the patient also reported a history of spinal stenosis, for which he takes Tramadol and Methocarbamol. The patient reported taking these medications primarily once a day, although occasionally twice a day during colder weather when symptoms worsen.  The patient also reported a significant weight loss of approximately 65 pounds, attributed to the use of Ozempic. He reported occasional constipation, which he manages with fiber gummies.  The patient also mentioned taking Vitamin B12 once a week and Vitamin D daily, as per previous medical advice. He reported a history of good blood pressure control, with readings typically in the range of 112/64 at home. He also reported good blood sugar control, with recent readings around 6.0-6.1.  The patient also reported a recent completion of a Cologuard test, with no  history of coronary artery disease. He has received his COVID-19 booster shot and is up-to-date with his vaccinations.   Past Medical History:  Diagnosis Date   Arthritis    COVID-19    12/2020   Diabetes mellitus without complication (HCC)    Essential hypertension    Essential hypertension    Hyperlipidemia    Left knee pain    Lymphedema    Morbid obesity with BMI of 50.0-59.9, adult (HCC)    OSA on CPAP    Past Surgical History:  Procedure Laterality Date   knee surgery     left 62 y.o    UMBILICAL HERNIA REPAIR     Family Status  Relation Name Status   Mother  (Not Specified)   Daughter  Alive  No partnership data on file   Family History  Problem Relation Age of Onset   Hypertension Mother    Heart disease Mother    Social History   Socioeconomic History   Marital status: Married    Spouse name: Not on file   Number of children: Not on file   Years of education: Not on file   Highest education level: 12th grade  Occupational History   Not on file  Tobacco Use   Smoking status: Never   Smokeless tobacco: Never  Substance and Sexual Activity   Alcohol use: Yes    Alcohol/week: 1.0 standard drink of alcohol    Types: 1 Cans of beer per week    Comment: rare   Drug use: No  Sexual activity: Yes    Partners: Female  Other Topics Concern   Not on file  Social History Narrative   Married    Kids (daughter)    grandson   Dump Truck driver -Chartered certified accountant      Social Drivers of Health   Financial Resource Strain: Medium Risk (04/13/2023)   Overall Financial Resource Strain (CARDIA)    Difficulty of Paying Living Expenses: Somewhat hard  Food Insecurity: No Food Insecurity (04/13/2023)   Hunger Vital Sign    Worried About Running Out of Food in the Last Year: Never true    Ran Out of Food in the Last Year: Never true  Transportation Needs: No Transportation Needs (04/13/2023)   PRAPARE - Administrator, Civil Service (Medical): No    Lack of  Transportation (Non-Medical): No  Physical Activity: Insufficiently Active (04/13/2023)   Exercise Vital Sign    Days of Exercise per Week: 2 days    Minutes of Exercise per Session: 20 min  Stress: No Stress Concern Present (04/13/2023)   [employer] of Occupational Health - Occupational Stress Questionnaire    Feeling of Stress : Not at all  Social Connections: Moderately Isolated (04/13/2023)   Social Connection and Isolation Panel [NHANES]    Frequency of Communication with Friends and Family: More than three times a week    Frequency of Social Gatherings with Friends and Family: Once a week    Attends Religious Services: Never    Database administrator or Organizations: No    Attends Engineer, structural: Not on file    Marital Status: Married   Outpatient Medications Prior to Visit  Medication Sig   Azelastine HCl 0.15 % SOLN Place 1-2 sprays into the nose daily as needed. If 2 sprays only use 1x per day.   blood glucose meter kit and supplies Dispense based on patient and insurance preference. Use up to four times daily as directed. (FOR ICD-10 E10.9, E11.9). 1 year lancets and strips   Insulin Pen Needle (PEN NEEDLES) 30G X 8 MM MISC 1 Device by Does not apply route once a week. Pen needles for ozempic   lisinopril (ZESTRIL) 30 MG tablet Take 1 tablet (30 mg total) by mouth daily.   methocarbamol (ROBAXIN) 500 MG tablet Take 1 tablet by mouth twice daily   rosuvastatin (CRESTOR) 10 MG tablet Take 1 tablet (10 mg total) by mouth daily.   Semaglutide, 2 MG/DOSE, (OZEMPIC, 2 MG/DOSE,) 8 MG/3ML SOPN INJECT 2MG  SUBCUTANEOUSLY ONCE WEEKLY   [EXPIRED] traMADol (ULTRAM) 50 MG tablet Take 1 tablet (50 mg total) by mouth 2 (two) times daily as needed.   [DISCONTINUED] loratadine (CLARITIN) 10 MG tablet TAKE 1 TABLET BY MOUTH ONCE DAILY AS NEEDED FOR ALLERGIES   No facility-administered medications prior to visit.   Allergies  Allergen Reactions   Latex Rash     Immunization History  Administered Date(s) Administered   Influenza,inj,Quad PF,6+ Mos 07/14/2017, 04/17/2018   Influenza-Unspecified 02/01/2023   PFIZER(Purple Top)SARS-COV-2 Vaccination 09/11/2019, 10/09/2019   Pfizer Covid-19 Vaccine Bivalent Booster 64yrs & up 02/01/2023   Tdap 09/13/2021   Zoster, Unspecified 02/01/2023    Health Maintenance  Topic Date Due   Pneumococcal Vaccine 10-98 Years old (1 of 2 - PCV) Never done   FOOT EXAM  Never done   OPHTHALMOLOGY EXAM  Never done   Zoster Vaccines- Shingrix (1 of 2) 07/18/2023 (Originally 10/26/2010)   COVID-19 Vaccine (4 - 2024-25 season) 02/02/2024 (Originally 03/29/2023)  HEMOGLOBIN A1C  09/29/2023   Diabetic kidney evaluation - eGFR measurement  03/30/2024   Diabetic kidney evaluation - Urine ACR  03/30/2024   Fecal DNA (Cologuard)  11/16/2025   DTaP/Tdap/Td (2 - Td or Tdap) 09/14/2031   INFLUENZA VACCINE  Completed   Hepatitis C Screening  Completed   HIV Screening  Completed   HPV VACCINES  Aged Out   Colonoscopy  Discontinued    Patient Care Team: Russie Gulledge N, DO as PCP - General (Family Medicine)       Objective    BP 133/81 (BP Location: Left Arm, Patient Position: Sitting, Cuff Size: Normal)   Pulse 78   Ht 5\' 10"  (1.778 m)   Wt (!) 316 lb (143.3 kg)   SpO2 97%   BMI 45.34 kg/m     Physical Exam Vitals and nursing note reviewed.  Constitutional:      General: He is not in acute distress.    Appearance: Normal appearance.  HENT:     Head: Normocephalic and atraumatic.  Eyes:     General: No scleral icterus.    Conjunctiva/sclera: Conjunctivae normal.  Cardiovascular:     Rate and Rhythm: Normal rate.  Pulmonary:     Effort: Pulmonary effort is normal.  Neurological:     Mental Status: He is alert and oriented to person, place, and time. Mental status is at baseline.  Psychiatric:        Mood and Affect: Mood normal.        Behavior: Behavior normal.     Depression Screen     04/14/2023    4:00 PM 09/26/2022   11:50 AM 11/14/2021   11:10 AM 10/16/2021    2:21 PM  PHQ 2/9 Scores  PHQ - 2 Score 0 0 0 0  PHQ- 9 Score 0 0     No results found for any visits on 05/07/23.  Assessment & Plan     Establishing care with new doctor, encounter for  Hyperlipidemia associated with type 2 diabetes mellitus (HCC) Assessment & Plan: Diabetes Mellitus Type 2 Well-controlled on Ozempic with recent weight loss of approximately 65 pounds. Blood sugars and A1c levels are stable. No recent complications. Discussed benefits of continued Ozempic use and importance of regular blood sugar monitoring. - Continue Ozempic as prescribed - Encourage regular blood sugar monitoring - Schedule follow-up in six months  Hyperlipidemia Managed with rosuvastatin (Crestor). No reported side effects when taken at night. Discussed benefits of evening dosing to minimize side effects. - Continue rosuvastatin (Crestor) as prescribed - Advise evening dosing to minimize side effects    Hypertension associated with diabetes (HCC) Assessment & Plan: Well-controlled with lisinopril. Home blood pressure readings are consistently within normal range. Discussed importance of regular monitoring and comparison with office readings. - Continue lisinopril as prescribed - Encourage regular home blood pressure monitoring - Bring home blood pressure cuff to next appointment for comparison   Vitamin D deficiency Assessment & Plan: Previous level 18.22 on 11/13/2018; has been stable on current supplementation. -Continue vitamin D 5000 IU daily   OSA on CPAP Assessment & Plan: Compliant with CPAP   Allergic rhinitis, unspecified seasonality, unspecified trigger -     Loratadine; TAKE 1 TABLET BY MOUTH ONCE DAILY AS NEEDED FOR ALLERGIES  Dispense: 90 tablet; Refill: 3  Spinal stenosis of lumbosacral region Assessment & Plan: Chronic condition managed with tramadol and methocarbamol. Effective pain  control with current regimen. Previous evaluation suggested injections would not be beneficial. Discussed  preference for current medication regimen over potential injections. - Continue tramadol and methocarbamol as prescribed - Monitor for any changes in symptoms   Lumbar radiculopathy Assessment & Plan: As noted above.   Finger Blisters Blisters on the finger secondary to an allergic reaction to Band-Aids, present for at least four days. No signs of cellulitis. Low suspicion for infection. Discussed potential for cellulitis development and need for antibiotics if symptoms change. - Monitor for signs of cellulitis - Prescribe antibiotics if signs of infection develop  General Health Maintenance Routine health maintenance discussed including vitamin supplementation, prostate screening, and colon cancer screening. Discussed importance of regular eye exams for diabetic retinopathy and continuation of Cologuard for colon cancer screening. - Take vitamin B12 500 mcg daily - Schedule annual eye exam for diabetic retinopathy screening - Continue with Cologuard for colon cancer screening - Recheck vitamin levels in six months (late April to early May 2025)  Return in about 5 months (around 10/20/2023) for HTN, DM.     I discussed the assessment and treatment plan with the patient  The patient was provided an opportunity to ask questions and all were answered. The patient agreed with the plan and demonstrated an understanding of the instructions.   The patient was advised to call back or seek an in-person evaluation if the symptoms worsen or if the condition fails to improve as anticipated.    Sherlyn Hay, DO  [facility] [phone] (phone) [phone] (fax)  [facility]

## 2023-05-27 ENCOUNTER — Encounter: Payer: Self-pay | Admitting: Family Medicine

## 2023-05-27 NOTE — Assessment & Plan Note (Signed)
Chronic condition managed with tramadol and methocarbamol. Effective pain control with current regimen. Previous evaluation suggested injections would not be beneficial. Discussed preference for current medication regimen over potential injections. - Continue tramadol and methocarbamol as prescribed - Monitor for any changes in symptoms

## 2023-05-27 NOTE — Assessment & Plan Note (Signed)
As noted above 

## 2023-05-27 NOTE — Assessment & Plan Note (Signed)
Well-controlled with lisinopril. Home blood pressure readings are consistently within normal range. Discussed importance of regular monitoring and comparison with office readings. - Continue lisinopril as prescribed - Encourage regular home blood pressure monitoring - Bring home blood pressure cuff to next appointment for comparison

## 2023-05-27 NOTE — Assessment & Plan Note (Signed)
Diabetes Mellitus Type 2 Well-controlled on Ozempic with recent weight loss of approximately 65 pounds. Blood sugars and A1c levels are stable. No recent complications. Discussed benefits of continued Ozempic use and importance of regular blood sugar monitoring. - Continue Ozempic as prescribed - Encourage regular blood sugar monitoring - Schedule follow-up in six months  Hyperlipidemia Managed with rosuvastatin (Crestor). No reported side effects when taken at night. Discussed benefits of evening dosing to minimize side effects. - Continue rosuvastatin (Crestor) as prescribed - Advise evening dosing to minimize side effects

## 2023-05-27 NOTE — Assessment & Plan Note (Signed)
Previous level 18.22 on 11/13/2018; has been stable on current supplementation. -Continue vitamin D 5000 IU daily

## 2023-07-07 ENCOUNTER — Other Ambulatory Visit: Payer: Self-pay | Admitting: Family Medicine

## 2023-07-07 DIAGNOSIS — M51369 Other intervertebral disc degeneration, lumbar region without mention of lumbar back pain or lower extremity pain: Secondary | ICD-10-CM

## 2023-07-07 DIAGNOSIS — M4807 Spinal stenosis, lumbosacral region: Secondary | ICD-10-CM

## 2023-07-07 DIAGNOSIS — M47816 Spondylosis without myelopathy or radiculopathy, lumbar region: Secondary | ICD-10-CM

## 2023-07-07 DIAGNOSIS — M545 Low back pain, unspecified: Secondary | ICD-10-CM

## 2023-07-08 NOTE — Telephone Encounter (Signed)
Refilled: 03/19/2023 Last OV: 04/14/2023 Next OV: not scheduled.

## 2023-07-11 ENCOUNTER — Telehealth: Payer: Self-pay

## 2023-07-11 DIAGNOSIS — E118 Type 2 diabetes mellitus with unspecified complications: Secondary | ICD-10-CM

## 2023-07-11 DIAGNOSIS — M5416 Radiculopathy, lumbar region: Secondary | ICD-10-CM

## 2023-07-11 DIAGNOSIS — M4807 Spinal stenosis, lumbosacral region: Secondary | ICD-10-CM

## 2023-07-11 NOTE — Telephone Encounter (Signed)
 Copied from CRM [id number]. Topic: Clinical - Prescription Issue >> Jul 11, 2023  1:24 PM Juleen Oakland F wrote: Reason for CRM: Patient called regarding the TraMADol  medication, [facility] pharmacy on file told him it was denied

## 2023-07-14 ENCOUNTER — Other Ambulatory Visit: Payer: Self-pay | Admitting: Family Medicine

## 2023-07-14 DIAGNOSIS — M47816 Spondylosis without myelopathy or radiculopathy, lumbar region: Secondary | ICD-10-CM

## 2023-07-14 DIAGNOSIS — M4807 Spinal stenosis, lumbosacral region: Secondary | ICD-10-CM

## 2023-07-14 DIAGNOSIS — E118 Type 2 diabetes mellitus with unspecified complications: Secondary | ICD-10-CM

## 2023-07-14 DIAGNOSIS — G8929 Other chronic pain: Secondary | ICD-10-CM

## 2023-07-14 DIAGNOSIS — M51369 Other intervertebral disc degeneration, lumbar region without mention of lumbar back pain or lower extremity pain: Secondary | ICD-10-CM

## 2023-07-14 DIAGNOSIS — M545 Low back pain, unspecified: Secondary | ICD-10-CM

## 2023-07-14 NOTE — Telephone Encounter (Signed)
Copied from CRM [id number]. Topic: General - Other >> Jul 14, 2023  8:38 AM Everette C wrote: Reason for CRM: Medication Refill - Most Recent Primary Care Visit:  Provider: Sherlyn Hay Department: [facility] Visit Type: NEW PATIENT Date: 05/07/2023  Medication: Semaglutide, 2 MG/DOSE, (OZEMPIC, 2 MG/DOSE,) 8 MG/3ML SOPN [[id number]]  traMADol (ULTRAM) 50 MG tablet [[id number]]  Has the patient contacted their pharmacy? Yes (Agent: If no, request that the patient contact the pharmacy for the refill. If patient does not wish to contact the pharmacy document the reason why and proceed with request.) (Agent: If yes, when and what did the pharmacy advise?)  Is this the correct pharmacy for this prescription? Yes If no, delete pharmacy and type the correct one.  This is the patient's preferred pharmacy:  [facility] [address] [place] - [address] [place] [address] Phone: [phone] Fax: [phone]   Has the prescription been filled recently? Yes  Is the patient out of the medication? No  Has the patient been seen for an appointment in the last year OR does the patient have an upcoming appointment? Yes  Can we respond through MyChart? No  Agent: Please be advised that Rx refills may take up to 3 business days. We ask that you follow-up with your pharmacy.

## 2023-07-15 ENCOUNTER — Telehealth: Payer: Self-pay | Admitting: Family Medicine

## 2023-07-15 ENCOUNTER — Telehealth: Payer: Self-pay

## 2023-07-15 ENCOUNTER — Other Ambulatory Visit (HOSPITAL_COMMUNITY): Payer: Self-pay

## 2023-07-15 MED ORDER — OZEMPIC (2 MG/DOSE) 8 MG/3ML ~~LOC~~ SOPN: PEN_INJECTOR | SUBCUTANEOUS | 11 refills | Status: DC

## 2023-07-15 MED ORDER — TRAMADOL HCL 50 MG PO TABS: 50.0000 mg | ORAL_TABLET | Freq: Two times a day (BID) | ORAL | 0 refills | Status: DC | PRN

## 2023-07-15 NOTE — Telephone Encounter (Signed)
Requested medication (s) are due for refill today -no  Requested medication (s) are on the active medication list -yes  Future visit scheduled -yes  Last refill: Semaglutide 10/08/22 3ml 11RF                 Tramadol 07/15/23 #60  Notes to clinic: outside provider, non delegated Rx  Requested Prescriptions  Pending Prescriptions Disp Refills   Semaglutide, 2 MG/DOSE, (OZEMPIC, 2 MG/DOSE,) 8 MG/3ML SOPN 3 mL 11    Sig: INJECT 2MG  SUBCUTANEOUSLY ONCE WEEKLY     Endocrinology:  Diabetes - GLP-1 Receptor Agonists - semaglutide Passed - 07/15/2023 11:06 AM      Passed - HBA1C in normal range and within 180 days    Hgb A1c MFr Bld  Date Value Ref Range Status  03/31/2023 6.0 4.6 - 6.5 % Final    Comment:    Glycemic Control Guidelines for People with Diabetes:Non Diabetic:  <6%Goal of Therapy: <7%Additional Action Suggested:  >8%          Passed - Cr in normal range and within 360 days    Creatinine, Ser  Date Value Ref Range Status  03/31/2023 1.07 0.40 - 1.50 mg/dL Final   Creatinine,U  Date Value Ref Range Status  03/31/2023 112.8 mg/dL Final         Passed - Valid encounter within last 6 months    Recent Outpatient Visits           2 months ago Establishing care with new doctor, encounter for   [facility] Pardue, Monico Blitz, DO       Future Appointments             In 3 months Pardue, Monico Blitz, DO [facility]             traMADol (ULTRAM) 50 MG tablet 60 tablet 0    Sig: Take 1 tablet (50 mg total) by mouth 2 (two) times daily as needed.     Not Delegated - Analgesics:  Opioid Agonists Failed - 07/15/2023 11:06 AM      Failed - This refill cannot be delegated      Failed - Urine Drug Screen completed in last 360 days      Passed - Valid encounter within last 3 months    Recent Outpatient Visits           2 months ago Establishing care with new doctor, encounter for   [facility] Pardue, Monico Blitz, DO       Future Appointments             In 3 months Pardue, Monico Blitz, DO [place] [facility], [facility]               Requested Prescriptions  Pending Prescriptions Disp Refills   Semaglutide, 2 MG/DOSE, (OZEMPIC, 2 MG/DOSE,) 8 MG/3ML SOPN 3 mL 11    Sig: INJECT 2MG  SUBCUTANEOUSLY ONCE WEEKLY     Endocrinology:  Diabetes - GLP-1 Receptor Agonists - semaglutide Passed - 07/15/2023 11:06 AM      Passed - HBA1C in normal range and within 180 days    Hgb A1c MFr Bld  Date Value Ref Range Status  03/31/2023 6.0 4.6 - 6.5 % Final    Comment:    Glycemic Control Guidelines for People with Diabetes:Non Diabetic:  <6%Goal of Therapy: <7%Additional Action Suggested:  >8%  Passed - Cr in normal range and within 360 days    Creatinine, Ser  Date Value Ref Range Status  03/31/2023 1.07 0.40 - 1.50 mg/dL Final   Creatinine,U  Date Value Ref Range Status  03/31/2023 112.8 mg/dL Final         Passed - Valid encounter within last 6 months    Recent Outpatient Visits           2 months ago Establishing care with new doctor, encounter for   [facility] Pardue, Monico Blitz, DO       Future Appointments             In 3 months Pardue, Monico Blitz, DO [facility]             traMADol (ULTRAM) 50 MG tablet 60 tablet 0    Sig: Take 1 tablet (50 mg total) by mouth 2 (two) times daily as needed.     Not Delegated - Analgesics:  Opioid Agonists Failed - 07/15/2023 11:06 AM      Failed - This refill cannot be delegated      Failed - Urine Drug Screen completed in last 360 days      Passed - Valid encounter within last 3 months    Recent Outpatient Visits           2 months ago Establishing care with new doctor, encounter for   [facility] Pardue, Monico Blitz, DO       Future Appointments             In 3 months Pardue, Monico Blitz, DO [place]  [facility], [facility]

## 2023-07-15 NOTE — Addendum Note (Signed)
Addended by: Jacquenette Shone on: 07/15/2023 12:45 PM   Modules accepted: Orders

## 2023-07-15 NOTE — Telephone Encounter (Signed)
Pharmacy Patient Advocate Encounter   Received notification from CoverMyMeds that prior authorization for Ozempic (2 MG/DOSE) 8MG /3ML pen-injectors is required/requested.   Insurance verification completed.   The patient is insured through [employer] .   Per test claim: PA required; PA submitted to above mentioned insurance via CoverMyMeds Key/confirmation #/EOC [id number] Status is pending

## 2023-07-15 NOTE — Telephone Encounter (Signed)
Covermymeds is requesting prior authorization Key: [id number] Ozempic 2MG /Dose 8 MG/3ML pen injectors

## 2023-07-15 NOTE — Addendum Note (Signed)
Addended by: Jacquenette Shone on: 07/15/2023 07:21 AM   Modules accepted: Orders

## 2023-07-18 ENCOUNTER — Other Ambulatory Visit (HOSPITAL_COMMUNITY): Payer: Self-pay

## 2023-07-21 NOTE — Telephone Encounter (Signed)
Pt called for status update on Prior Auth, he is completely out

## 2023-07-22 ENCOUNTER — Other Ambulatory Visit (HOSPITAL_COMMUNITY): Payer: Self-pay

## 2023-07-22 NOTE — Telephone Encounter (Signed)
Pharmacy Patient Advocate Encounter  Received notification from [employer] that Prior Authorization for Ozempic (2 MG/DOSE) 8MG /3ML pen-injectors has been APPROVED from 07/22/23 to 01/19/24. Ran test claim, Copay is $25. This test claim was processed through [facility]- copay amounts may vary at other pharmacies due to pharmacy/plan contracts, or as the patient moves through the different stages of their insurance plan.   PA #/Case ID/Reference #: [id number]   *spoke with [facility] to process

## 2023-07-25 ENCOUNTER — Other Ambulatory Visit (HOSPITAL_COMMUNITY): Payer: Self-pay

## 2023-08-25 ENCOUNTER — Telehealth: Payer: Self-pay | Admitting: Family Medicine

## 2023-08-25 ENCOUNTER — Other Ambulatory Visit: Payer: Self-pay | Admitting: Family Medicine

## 2023-08-25 NOTE — Telephone Encounter (Signed)
 [facility] is requesting refill methocarbamol (ROBAXIN) 500 MG tablet   Please advise

## 2023-08-26 ENCOUNTER — Other Ambulatory Visit: Payer: Self-pay

## 2023-08-26 MED ORDER — METHOCARBAMOL 500 MG PO TABS: 500.0000 mg | ORAL_TABLET | Freq: Two times a day (BID) | ORAL | 0 refills | Status: DC

## 2023-08-31 ENCOUNTER — Other Ambulatory Visit: Payer: Self-pay | Admitting: Family Medicine

## 2023-08-31 DIAGNOSIS — I10 Essential (primary) hypertension: Secondary | ICD-10-CM

## 2023-09-14 ENCOUNTER — Other Ambulatory Visit: Payer: Self-pay | Admitting: Family Medicine

## 2023-09-14 DIAGNOSIS — M5416 Radiculopathy, lumbar region: Secondary | ICD-10-CM

## 2023-09-14 DIAGNOSIS — M4807 Spinal stenosis, lumbosacral region: Secondary | ICD-10-CM

## 2023-09-15 ENCOUNTER — Other Ambulatory Visit: Payer: Self-pay | Admitting: Family Medicine

## 2023-09-15 DIAGNOSIS — M4807 Spinal stenosis, lumbosacral region: Secondary | ICD-10-CM

## 2023-09-15 DIAGNOSIS — M5416 Radiculopathy, lumbar region: Secondary | ICD-10-CM

## 2023-09-15 NOTE — Telephone Encounter (Signed)
 Copied from CRM [id number]. Topic: Clinical - Medication Refill >> Sep 15, 2023  2:29 PM Georgeann Kindred wrote: Most Recent Primary Care Visit:  Provider: Carlean Charter  Department: [id number]  Visit Type: NEW PATIENT  Date: 05/07/2023  Medication: traMADol (ULTRAM) 50 MG tablet  Has the patient contacted their pharmacy? Yes (Agent: If no, request that the patient contact the pharmacy for the refill. If patient does not wish to contact the pharmacy document the reason why and proceed with request.) (Agent: If yes, when and what did the pharmacy advise?) Have not received a response   Is this the correct pharmacy for this prescription? Yes If no, delete pharmacy and type the correct one.  This is the patient's preferred pharmacy:  [facility] [address], [place] - [address] [place] [address] Phone: [phone] Fax: [phone]   Has the prescription been filled recently? No  Is the patient out of the medication? No 5-6 days remaining   Has the patient been seen for an appointment in the last year OR does the patient have an upcoming appointment? Yes  Can we respond through MyChart? Yes  Agent: Please be advised that Rx refills may take up to 3 business days. We ask that you follow-up with your pharmacy.

## 2023-09-16 NOTE — Telephone Encounter (Signed)
 Requested medication (s) are due for refill today: yes   Requested medication (s) are on the active medication list: yes   Last refill:  07/15/23 #60 0 refills  Future visit scheduled: yes in 1 month  Notes to clinic:  not delegated per protocol. Last OV 05/07/23 do you want to refill Rx?     Requested Prescriptions  Pending Prescriptions Disp Refills   traMADol (ULTRAM) 50 MG tablet [Pharmacy Med Name: traMADol HCl 50 MG Oral Tablet] 60 tablet 0    Sig: TAKE 1 TABLET BY MOUTH EVERY 12 HOURS AS NEEDED     Not Delegated - Analgesics:  Opioid Agonists Failed - 09/16/2023  8:27 AM      Failed - This refill cannot be delegated      Failed - Urine Drug Screen completed in last 360 days      Failed - Valid encounter within last 3 months    Recent Outpatient Visits   None     Future Appointments             In 1 month Pardue, Asencion Blacksmith, DO [facility], [facility]

## 2023-09-16 NOTE — Telephone Encounter (Signed)
 Requested medications are due for refill today.  yes  Requested medications are on the active medications list.  yes  Last refill. 07/15/2023 #60 0 rf  Future visit scheduled.   yes  Notes to clinic.  Refill not delegated.    Requested Prescriptions  Pending Prescriptions Disp Refills   traMADol (ULTRAM) 50 MG tablet 60 tablet 0    Sig: Take 1 tablet (50 mg total) by mouth every 12 (twelve) hours as needed.     Not Delegated - Analgesics:  Opioid Agonists Failed - 09/16/2023  2:25 PM      Failed - This refill cannot be delegated      Failed - Urine Drug Screen completed in last 360 days      Failed - Valid encounter within last 3 months    Recent Outpatient Visits   None     Future Appointments             In 1 month Pardue, Asencion Blacksmith, DO [place], [facility]

## 2023-10-20 ENCOUNTER — Encounter: Payer: Self-pay | Admitting: Family Medicine

## 2023-10-20 ENCOUNTER — Ambulatory Visit: Payer: Managed Care, Other (non HMO) | Admitting: Family Medicine

## 2023-10-20 VITALS — BP 111/71 | HR 81 | Temp 98.1°F | Ht 70.0 in | Wt 307.5 lb

## 2023-10-20 DIAGNOSIS — Z6841 Body Mass Index (BMI) 40.0 and over, adult: Secondary | ICD-10-CM

## 2023-10-20 DIAGNOSIS — E1159 Type 2 diabetes mellitus with other circulatory complications: Secondary | ICD-10-CM | POA: Diagnosis not present

## 2023-10-20 DIAGNOSIS — M51369 Other intervertebral disc degeneration, lumbar region without mention of lumbar back pain or lower extremity pain: Secondary | ICD-10-CM

## 2023-10-20 DIAGNOSIS — Z23 Encounter for immunization: Secondary | ICD-10-CM

## 2023-10-20 DIAGNOSIS — F1190 Opioid use, unspecified, uncomplicated: Secondary | ICD-10-CM

## 2023-10-20 DIAGNOSIS — E1169 Type 2 diabetes mellitus with other specified complication: Secondary | ICD-10-CM

## 2023-10-20 DIAGNOSIS — M4807 Spinal stenosis, lumbosacral region: Secondary | ICD-10-CM

## 2023-10-20 DIAGNOSIS — E118 Type 2 diabetes mellitus with unspecified complications: Secondary | ICD-10-CM

## 2023-10-20 DIAGNOSIS — E785 Hyperlipidemia, unspecified: Secondary | ICD-10-CM

## 2023-10-20 DIAGNOSIS — E559 Vitamin D deficiency, unspecified: Secondary | ICD-10-CM

## 2023-10-20 DIAGNOSIS — I152 Hypertension secondary to endocrine disorders: Secondary | ICD-10-CM

## 2023-10-20 DIAGNOSIS — M5416 Radiculopathy, lumbar region: Secondary | ICD-10-CM

## 2023-10-20 DIAGNOSIS — G4733 Obstructive sleep apnea (adult) (pediatric): Secondary | ICD-10-CM

## 2023-10-20 MED ORDER — METHOCARBAMOL 500 MG PO TABS: 500.0000 mg | ORAL_TABLET | Freq: Two times a day (BID) | ORAL | 2 refills | Status: DC

## 2023-10-20 MED ORDER — TRAMADOL HCL 50 MG PO TABS
50.0000 mg | ORAL_TABLET | Freq: Two times a day (BID) | ORAL | 2 refills | Status: DC | PRN
Start: 2023-10-20 — End: 2024-04-22

## 2023-10-20 NOTE — Progress Notes (Signed)
 Established patient visit   Patient: John Orozco   DOB: 1960-10-06   63 y.o. Male  MRN: [id number] Visit Date: 10/20/2023  Today's healthcare provider: Carlean Charter, DO   Chief Complaint  Patient presents with   Medical Management of Chronic Issues   Hypertension    Hypertension Patient is here for follow-up of elevated blood pressure. He is not exercising, but does in the summer wihile in his pool.  He is not adherent to a low-salt diet but avoid it to a degree.  Blood pressure is well controlled at home. Cardiac symptoms: fatigue. Patient denies any symptoms.   Diabetes    Saketh Daubert is a 63 y.o. male who presents for follow up of diabetes.. Current symptoms include: none. Patient denies any symptoms. Reports that it seems to be good.    Next eye exam 10/22/2023.   Subjective    HPI John Orozco is a 63 year old male with hypertension who presents for a routine follow-up visit.  He occasionally checks his blood pressure at home but not consistently. He experienced dizziness about two months ago when moving or lying down, which he attributed to dehydration. He increased his water intake, and the dizziness resolved. An eye exam is scheduled in two days.  He exercises more during the summer, particularly in his pool, which helps with his back pain. He has not been asked about pneumonia or shingles vaccines recently, but he recalls receiving the first part of the shingles vaccine and believes he is due for the second dose. He also received a PPSV-23 pneumonia vaccine previously.  He does not check his blood sugars regularly but states they usually run well. He experienced dizziness previously but checked his blood sugar at that time, and it was normal. He attributes the dizziness to dehydration and recalls that prior blood work showed he was a little dehydrated.  He takes tramadol  and methocarbamol  for chronic back pain, which he describes as constant but tolerable with  medication. He takes tramadol  daily, sometimes twice a day, and methocarbamol  as needed. He occasionally uses Aleve  or Tylenol  PM for additional relief, noting that Tylenol  PM helps him sleep when his back pain is bothersome.  He uses a CPAP machine regularly and reports improved sleep quality, stating he can function well on six hours of sleep. He works as a Psychologist, occupational and describes occasional burns from hot metal, which he manages by wearing protective gear. No numbness or tingling in his feet and reports good sensation and no pain in his calves when walking.      Medications: Outpatient Medications Prior to Visit  Medication Sig   Azelastine  HCl 0.15 % SOLN Place 1-2 sprays into the nose daily as needed. If 2 sprays only use 1x per day.   blood glucose meter kit and supplies Dispense based on patient and insurance preference. Use up to four times daily as directed. (FOR ICD-10 E10.9, E11.9). 1 year lancets and strips   Insulin Pen Needle (PEN NEEDLES) 30G X 8 MM MISC 1 Device by Does not apply route once a week. Pen needles for ozempic    lisinopril  (ZESTRIL ) 30 MG tablet Take 1 tablet by mouth once daily   loratadine  (CLARITIN ) 10 MG tablet TAKE 1 TABLET BY MOUTH ONCE DAILY AS NEEDED FOR ALLERGIES   rosuvastatin  (CRESTOR ) 10 MG tablet Take 1 tablet (10 mg total) by mouth daily.   Semaglutide , 2 MG/DOSE, (OZEMPIC , 2 MG/DOSE,) 8 MG/3ML SOPN INJECT 2MG  SUBCUTANEOUSLY ONCE  WEEKLY   [DISCONTINUED] methocarbamol  (ROBAXIN ) 500 MG tablet Take 1 tablet (500 mg total) by mouth 2 (two) times daily.   [DISCONTINUED] traMADol  (ULTRAM ) 50 MG tablet TAKE 1 TABLET BY MOUTH EVERY 12 HOURS AS NEEDED   No facility-administered medications prior to visit.    Review of Systems  Eyes:  Negative for visual disturbance.  Respiratory: Negative.  Negative for cough, shortness of breath and wheezing.   Cardiovascular:  Negative for chest pain, palpitations and leg swelling.  Neurological:  Negative for dizziness (2  months ago; resolved after increasing water intake), weakness, light-headedness and headaches.        Objective    BP 111/71 (BP Location: Right Arm, Patient Position: Sitting, Cuff Size: Small)   Pulse 81   Temp 98.1 F (36.7 C) (Oral)   Ht 5\' 10"  (1.778 m)   Wt (!) 307 lb 8 oz (139.5 kg)   SpO2 97%   BMI 44.12 kg/m     Physical Exam Constitutional:      Appearance: Normal appearance.  HENT:     Head: Normocephalic and atraumatic.  Eyes:     General: No scleral icterus.    Conjunctiva/sclera: Conjunctivae normal.  Cardiovascular:     Pulses:          Dorsalis pedis pulses are 2+ on the right side and 2+ on the left side.       Posterior tibial pulses are 2+ on the right side and 2+ on the left side.  Musculoskeletal:     Right lower leg: No edema.     Left lower leg: No edema.     Right foot: Normal range of motion. No deformity, bunion, Charcot foot, foot drop or prominent metatarsal heads.     Left foot: Normal range of motion. No deformity, bunion, Charcot foot, foot drop or prominent metatarsal heads.  Feet:     Right foot:     Protective Sensation: 10 sites tested.  10 sites sensed.     Skin integrity: No ulcer, blister, skin breakdown, erythema, warmth, callus, dry skin or fissure.     Toenail Condition: Right toenails are normal.     Left foot:     Protective Sensation: 10 sites tested.  10 sites sensed.     Skin integrity: No ulcer, blister, skin breakdown, erythema, warmth, callus, dry skin or fissure.     Toenail Condition: Left toenails are normal.  Skin:    Comments: +varicose veins and dusky skin discoloration to shins bilaterally (worse on left)  Neurological:     Mental Status: He is alert and oriented to person, place, and time. Mental status is at baseline.  Psychiatric:        Mood and Affect: Mood normal.        Behavior: Behavior normal.      No results found for any visits on 10/20/23.  Assessment & Plan    Type 2 diabetes with  complication (HCC) -     Hemoglobin A1c -     Vitamin B12  Hypertension associated with diabetes (HCC) -     Comprehensive metabolic panel with GFR  Type 2 diabetes mellitus with associated hyperlipidemia (HCC) -     Lipid panel  Vitamin D  deficiency -     VITAMIN D  25 Hydroxy (Vit-D Deficiency, Fractures)  Morbid obesity with BMI of 40.0-44.9, adult (HCC) -     Vitamin B12  OSA on CPAP  Spinal stenosis of lumbosacral region -  traMADol  HCl; Take 1 tablet (50 mg total) by mouth every 12 (twelve) hours as needed.  Dispense: 60 tablet; Refill: 2 -     Methocarbamol ; Take 1 tablet (500 mg total) by mouth 2 (two) times daily.  Dispense: 120 tablet; Refill: 2  Lumbar radiculopathy -     traMADol  HCl; Take 1 tablet (50 mg total) by mouth every 12 (twelve) hours as needed.  Dispense: 60 tablet; Refill: 2 -     Methocarbamol ; Take 1 tablet (500 mg total) by mouth 2 (two) times daily.  Dispense: 120 tablet; Refill: 2  Bulging lumbar disc  Chronic, continuous use of opioids -     Vitamin B12  Encounter for Prevnar pneumococcal vaccination -     Pneumococcal conjugate vaccine 20-valent  Need for shingles vaccine -     Varicella-zoster vaccine IM      Lumbar radiculopathy; spinal stenosis of lumbosacral region; bulging lumbar disc; chronic continuous use of opioids Chronic back pain, well-managed with tramadol  and methocarbamol . Discussed potential use of Aleve  as an alternative. Advised to consider Benadryl alone for sleep, rather than Tylenol  PM, if the Tylenol  component does not seem to have helped. - Continue tramadol  and methocarbamol  as prescribed. - Consider alternating with Aleve  on some days. - Try using Benadryl instead of Tylenol  PM for sleep if effective.  Hypertension Chronic, stable, well-controlled. Not consistently checking blood pressure at home. - Encourage regular home blood pressure monitoring.  Type 2 diabetes mellitus with complications (associated with  hypertension and hyperlipidemia) Type 2 diabetes mellitus, well-controlled. Last A1c was 6.0 on 03/31/2023. Not checking blood sugars regularly. - Continue current diabetes management plan. - Encourage regular blood sugar monitoring.  Morbid obesity with BMI 40.0-44.9 Counseled patient on diet and exercise.  Obstructive sleep apnea Obstructive sleep apnea, well-managed with CPAP. Reports improved sleep quality. - Continue using CPAP as prescribed.    Return in about 7 months (around 05/07/2024) for CPE.      I discussed the assessment and treatment plan with the patient  The patient was provided an opportunity to ask questions and all were answered. The patient agreed with the plan and demonstrated an understanding of the instructions.   The patient was advised to call back or seek an in-person evaluation if the symptoms worsen or if the condition fails to improve as anticipated.    Carlean Charter, DO  [facility] [phone] (phone) [phone] (fax)  [facility]

## 2023-10-21 ENCOUNTER — Ambulatory Visit: Payer: Self-pay | Admitting: Family Medicine

## 2023-10-21 LAB — COMPREHENSIVE METABOLIC PANEL WITH GFR
ALT: 23 IU/L (ref 0–44)
AST: 18 IU/L (ref 0–40)
Albumin: 4.5 g/dL (ref 3.9–4.9)
Alkaline Phosphatase: 59 IU/L (ref 44–121)
BUN/Creatinine Ratio: 20 (ref 10–24)
BUN: 23 mg/dL (ref 8–27)
Bilirubin Total: 0.4 mg/dL (ref 0.0–1.2)
CO2: 22 mmol/L (ref 20–29)
Calcium: 9.8 mg/dL (ref 8.6–10.2)
Chloride: 105 mmol/L (ref 96–106)
Creatinine, Ser: 1.16 mg/dL (ref 0.76–1.27)
Globulin, Total: 2.3 g/dL (ref 1.5–4.5)
Glucose: 118 mg/dL — ABNORMAL HIGH (ref 70–99)
Potassium: 4.4 mmol/L (ref 3.5–5.2)
Sodium: 142 mmol/L (ref 134–144)
Total Protein: 6.8 g/dL (ref 6.0–8.5)
eGFR: 71 mL/min/{1.73_m2} (ref >59)

## 2023-10-21 LAB — LIPID PANEL
Chol/HDL Ratio: 2.7 ratio (ref 0.0–5.0)
Cholesterol, Total: 112 mg/dL (ref 100–199)
HDL: 42 mg/dL (ref >39)
LDL Chol Calc (NIH): 51 mg/dL (ref 0–99)
Triglycerides: 101 mg/dL (ref 0–149)
VLDL Cholesterol Cal: 19 mg/dL (ref 5–40)

## 2023-10-21 LAB — VITAMIN B12: Vitamin B-12: 829 pg/mL (ref 232–1245)

## 2023-10-21 LAB — HEMOGLOBIN A1C
Est. average glucose Bld gHb Est-mCnc: 120 mg/dL
Hgb A1c MFr Bld: 5.8 % — ABNORMAL HIGH (ref 4.8–5.6)

## 2023-10-21 LAB — VITAMIN D 25 HYDROXY (VIT D DEFICIENCY, FRACTURES): Vit D, 25-Hydroxy: 53.6 ng/mL (ref 30.0–100.0)

## 2023-10-22 LAB — OPHTHALMOLOGY REPORT-SCANNED

## 2023-11-26 ENCOUNTER — Other Ambulatory Visit: Payer: Self-pay | Admitting: Family Medicine

## 2023-11-26 DIAGNOSIS — I10 Essential (primary) hypertension: Secondary | ICD-10-CM

## 2024-01-05 ENCOUNTER — Other Ambulatory Visit: Payer: Self-pay | Admitting: Family Medicine

## 2024-01-05 DIAGNOSIS — M5416 Radiculopathy, lumbar region: Secondary | ICD-10-CM

## 2024-01-05 DIAGNOSIS — M4807 Spinal stenosis, lumbosacral region: Secondary | ICD-10-CM

## 2024-01-05 NOTE — Telephone Encounter (Unsigned)
 Copied from CRM #[id number]. Topic: Clinical - Medication Refill >> Jan 05, 2024  3:25 PM Montie POUR wrote: Medication: methocarbamol  (ROBAXIN ) 500 MG tablet  Has the patient contacted their pharmacy? Yes (Agent: If no, request that the patient contact the pharmacy for the refill. If patient does not wish to contact the pharmacy document the reason why and proceed with request.) (Agent: If yes, when and what did the pharmacy advise?) Pharmacy needs order to refill  This is the patient's preferred pharmacy:  [facility] [address] [place] - [address] [place] [address] Phone: [phone] Fax: [phone]  Is this the correct pharmacy for this prescription? Yes If no, delete pharmacy and type the correct one.   Has the prescription been filled recently? No  Is the patient out of the medication? No  Has the patient been seen for an appointment in the last year OR does the patient have an upcoming appointment? Yes  Can we respond through MyChart? Yes  Agent: Please be advised that Rx refills may take up to 3 business days. We ask that you follow-up with your pharmacy.

## 2024-01-07 MED ORDER — METHOCARBAMOL 500 MG PO TABS
500.0000 mg | ORAL_TABLET | Freq: Two times a day (BID) | ORAL | 2 refills | Status: AC
Start: 2024-01-07 — End: ?

## 2024-01-07 NOTE — Telephone Encounter (Signed)
 Requested medication (s) are due for refill today: yes  Requested medication (s) are on the active medication list: yes  Last refill:  10/20/23  Future visit scheduled: no  Notes to clinic:  Unable to refill per protocol, cannot delegate.      Requested Prescriptions  Pending Prescriptions Disp Refills   methocarbamol  (ROBAXIN ) 500 MG tablet 120 tablet 2    Sig: Take 1 tablet (500 mg total) by mouth 2 (two) times daily.     Not Delegated - Analgesics:  Muscle Relaxants Failed - 01/07/2024 10:38 AM      Failed - This refill cannot be delegated      Passed - Valid encounter within last 6 months    Recent Outpatient Visits           2 months ago Type 2 diabetes with complication [facility])   [facility], Lauraine SAILOR, DO

## 2024-02-20 ENCOUNTER — Other Ambulatory Visit: Payer: Self-pay | Admitting: Family Medicine

## 2024-02-20 DIAGNOSIS — I10 Essential (primary) hypertension: Secondary | ICD-10-CM

## 2024-03-26 ENCOUNTER — Other Ambulatory Visit: Payer: Self-pay | Admitting: Family Medicine

## 2024-03-26 DIAGNOSIS — I10 Essential (primary) hypertension: Secondary | ICD-10-CM

## 2024-03-29 NOTE — Telephone Encounter (Signed)
 Requested Prescriptions  Pending Prescriptions Disp Refills   lisinopril  (ZESTRIL ) 30 MG tablet [Pharmacy Med Name: Lisinopril  30 MG Oral Tablet] 90 tablet 0    Sig: Take 1 tablet by mouth once daily     Cardiovascular:  ACE Inhibitors Passed - 03/29/2024  1:46 PM      Passed - Cr in normal range and within 180 days    Creatinine, Ser  Date Value Ref Range Status  10/20/2023 1.16 0.76 - 1.27 mg/dL Final         Passed - K in normal range and within 180 days    Potassium  Date Value Ref Range Status  10/20/2023 4.4 3.5 - 5.2 mmol/L Final         Passed - Patient is not pregnant      Passed - Last BP in normal range    BP Readings from Last 1 Encounters:  10/20/23 111/71         Passed - Valid encounter within last 6 months    Recent Outpatient Visits           5 months ago Type 2 diabetes with complication [facility])   [facility] [place], Lauraine SAILOR, DO

## 2024-04-15 ENCOUNTER — Other Ambulatory Visit: Payer: Self-pay | Admitting: Family Medicine

## 2024-04-15 DIAGNOSIS — M4807 Spinal stenosis, lumbosacral region: Secondary | ICD-10-CM

## 2024-04-15 DIAGNOSIS — M5416 Radiculopathy, lumbar region: Secondary | ICD-10-CM

## 2024-04-16 ENCOUNTER — Other Ambulatory Visit: Payer: Self-pay | Admitting: Family Medicine

## 2024-04-16 DIAGNOSIS — E1169 Type 2 diabetes mellitus with other specified complication: Secondary | ICD-10-CM

## 2024-04-16 DIAGNOSIS — M5416 Radiculopathy, lumbar region: Secondary | ICD-10-CM

## 2024-04-16 DIAGNOSIS — M4807 Spinal stenosis, lumbosacral region: Secondary | ICD-10-CM

## 2024-04-16 NOTE — Telephone Encounter (Signed)
 Copied from CRM #[id number]. Topic: Clinical - Medication Question >> Apr 16, 2024  9:13 AM [name] wrote: Reason for CRM: Patient is calling in requesting a new prescription for rosuvastatin  (CRESTOR ) 10 MG tablet [[id number]] be sent in to [facility] on [address]. Patient says his previous provider originally prescribed it, but he changed to Dr. Donzella.

## 2024-04-16 NOTE — Telephone Encounter (Unsigned)
 Copied from CRM [id number]. Topic: Clinical - Medication Refill >> Apr 16, 2024  9:15 AM [name] wrote: Medication: traMADol  (ULTRAM ) 50 MG tablet [[id number]]  Has the patient contacted their pharmacy? Yes  (Agent: If yes, when and what did the pharmacy advise?) Contact office   This is the patient's preferred pharmacy:  [facility] [address], [place] - [address] [place] [address] Phone: [phone] Fax: [phone]  Is this the correct pharmacy for this prescription? Yes If no, delete pharmacy and type the correct one.   Has the prescription been filled recently? Yes  Is the patient out of the medication? Yes  Has the patient been seen for an appointment in the last year OR does the patient have an upcoming appointment? Yes  Can we respond through MyChart? Yes  Agent: Please be advised that Rx refills may take up to 3 business days. We ask that you follow-up with your pharmacy.

## 2024-04-17 NOTE — Telephone Encounter (Signed)
 Requested medication (s) are due for refill today: yes  Requested medication (s) are on the active medication list: yes  Last refill:  10/20/23  Future visit scheduled: yes  Notes to clinic:  Unable to refill per protocol, cannot delegate.      Requested Prescriptions  Pending Prescriptions Disp Refills   traMADol  (ULTRAM ) 50 MG tablet [Pharmacy Med Name: traMADol  HCl 50 MG Oral Tablet] 60 tablet 0    Sig: TAKE 1 TABLET BY MOUTH EVERY 12 HOURS AS NEEDED     Not Delegated - Analgesics:  Opioid Agonists Failed - 04/17/2024 11:37 AM      Failed - This refill cannot be delegated      Failed - Urine Drug Screen completed in last 360 days      Failed - Valid encounter within last 3 months    Recent Outpatient Visits           6 months ago Type 2 diabetes with complication [facility])   [facility] [place], John Orozco SAILOR, DO

## 2024-04-18 NOTE — Telephone Encounter (Signed)
 Requested medications are due for refill today.  yes  Requested medications are on the active medications list.  yes  Last refill. 10/20/2023 #60 2 rf  Future visit scheduled.   no  Notes to clinic.  Refill not delegated.    Requested Prescriptions  Pending Prescriptions Disp Refills   traMADol  (ULTRAM ) 50 MG tablet 60 tablet 2    Sig: Take 1 tablet (50 mg total) by mouth every 12 (twelve) hours as needed.     Not Delegated - Analgesics:  Opioid Agonists Failed - 04/18/2024  1:35 PM      Failed - This refill cannot be delegated      Failed - Urine Drug Screen completed in last 360 days      Failed - Valid encounter within last 3 months    Recent Outpatient Visits           6 months ago Type 2 diabetes with complication [facility])   [facility] Pardue, Lauraine SAILOR, DO

## 2024-04-18 NOTE — Telephone Encounter (Signed)
 Requested medications are due for refill today.  yes  Requested medications are on the active medications list.  yes  Last refill. 04/14/2023 #90 3 rf  Future visit scheduled.   yes  Notes to clinic.  Rx signed by Dr. Hope    Requested Prescriptions  Pending Prescriptions Disp Refills   rosuvastatin  (CRESTOR ) 10 MG tablet 90 tablet 3    Sig: Take 1 tablet (10 mg total) by mouth daily.     Cardiovascular:  Antilipid - Statins 2 Failed - 04/18/2024  1:34 PM      Failed - Lipid Panel in normal range within the last 12 months    Cholesterol, Total  Date Value Ref Range Status  10/20/2023 112 100 - 199 mg/dL Final   LDL Chol Calc (NIH)  Date Value Ref Range Status  10/20/2023 51 0 - 99 mg/dL Final   HDL  Date Value Ref Range Status  10/20/2023 42 >39 mg/dL Final   Triglycerides  Date Value Ref Range Status  10/20/2023 101 0 - 149 mg/dL Final         Passed - Cr in normal range and within 360 days    Creatinine, Ser  Date Value Ref Range Status  10/20/2023 1.16 0.76 - 1.27 mg/dL Final         Passed - Patient is not pregnant      Passed - Valid encounter within last 12 months    Recent Outpatient Visits           6 months ago Type 2 diabetes with complication [facility])   [facility] [place], Lauraine SAILOR, DO

## 2024-04-20 MED ORDER — ROSUVASTATIN CALCIUM 10 MG PO TABS: 10.0000 mg | ORAL_TABLET | Freq: Every day | ORAL | 3 refills | Status: DC

## 2024-04-20 NOTE — Telephone Encounter (Signed)
 LOV- 10/20/2023 NOV- None LRF- 10/20/2023 Outpatient Medication Detail   Disp Refills Start End   traMADol  (ULTRAM ) 50 MG tablet 60 tablet 2 10/20/2023 --   Sig - Route: Take 1 tablet (50 mg total) by mouth every 12 (twelve) hours as needed. - Oral   Sent to pharmacy as: traMADol  (ULTRAM ) 50 MG tablet   E-Prescribing Status: Receipt confirmed by pharmacy (10/20/2023  4:49 PM EDT)

## 2024-04-22 ENCOUNTER — Ambulatory Visit

## 2024-04-22 VITALS — BP 132/74 | HR 74 | Resp 16 | Ht 70.0 in | Wt 316.2 lb

## 2024-04-22 DIAGNOSIS — E118 Type 2 diabetes mellitus with unspecified complications: Secondary | ICD-10-CM

## 2024-04-22 DIAGNOSIS — Z23 Encounter for immunization: Secondary | ICD-10-CM | POA: Diagnosis not present

## 2024-04-22 DIAGNOSIS — F1190 Opioid use, unspecified, uncomplicated: Secondary | ICD-10-CM | POA: Diagnosis not present

## 2024-04-22 DIAGNOSIS — M4807 Spinal stenosis, lumbosacral region: Secondary | ICD-10-CM

## 2024-04-22 DIAGNOSIS — M5416 Radiculopathy, lumbar region: Secondary | ICD-10-CM

## 2024-04-22 MED ORDER — TRAMADOL HCL 50 MG PO TABS
50.0000 mg | ORAL_TABLET | Freq: Two times a day (BID) | ORAL | 0 refills | Status: AC | PRN
Start: 2024-06-21 — End: 2024-07-21

## 2024-04-22 MED ORDER — TRAMADOL HCL 50 MG PO TABS: 50.0000 mg | ORAL_TABLET | Freq: Two times a day (BID) | ORAL | 0 refills | Status: AC | PRN

## 2024-04-22 NOTE — Progress Notes (Signed)
 Established patient visit   Patient: John Orozco   DOB: 23-Mar-1961   62 y.o. Male  MRN: [id number] Visit Date: 04/22/2024  Today's healthcare provider: Isaiah DELENA Pepper, MD   Chief Complaint  Patient presents with   Medication Refill    Tramadol  for spinal stenosis for severe back pain. Pt reports doing pretty good with medication.    Subjective    HPI  Discussed the use of AI scribe software for clinical note transcription with the patient, who gave verbal consent to proceed.  History of Present Illness John Orozco is a 63 year old male with spinal stenosis who presents for medication refills.  He uses tramadol  to manage his spinal stenosis, taking it 1-2x daily. This regimen sufficiently controls his symptoms, which include significant pain that impairs his ability to walk if untreated. Without tramadol , he would be unable to get up after three or four days.  He occasionally supplements his pain management with acetaminophen , particularly in cold weather, but avoids frequent use.  Denies side effects   Medications: Outpatient Medications Prior to Visit  Medication Sig   Azelastine  HCl 0.15 % SOLN Place 1-2 sprays into the nose daily as needed. If 2 sprays only use 1x per day.   blood glucose meter kit and supplies Dispense based on patient and insurance preference. Use up to four times daily as directed. (FOR ICD-10 E10.9, E11.9). 1 year lancets and strips   Insulin Pen Needle (PEN NEEDLES) 30G X 8 MM MISC 1 Device by Does not apply route once a week. Pen needles for ozempic    lisinopril  (ZESTRIL ) 30 MG tablet Take 1 tablet by mouth once daily   loratadine  (CLARITIN ) 10 MG tablet TAKE 1 TABLET BY MOUTH ONCE DAILY AS NEEDED FOR ALLERGIES   methocarbamol  (ROBAXIN ) 500 MG tablet Take 1 tablet (500 mg total) by mouth 2 (two) times daily.   rosuvastatin  (CRESTOR ) 10 MG tablet Take 1 tablet (10 mg total) by mouth daily.   Semaglutide , 2 MG/DOSE, (OZEMPIC , 2 MG/DOSE,) 8  MG/3ML SOPN INJECT 2MG  SUBCUTANEOUSLY ONCE WEEKLY   [DISCONTINUED] traMADol  (ULTRAM ) 50 MG tablet Take 1 tablet (50 mg total) by mouth every 12 (twelve) hours as needed.   No facility-administered medications prior to visit.    Review of Systems as noted in HPI.      Objective    BP 132/74   Pulse 74   Resp 16   Ht 5' 10 (1.778 m)   Wt (!) 316 lb 3.2 oz (143.4 kg)   SpO2 99%   BMI 45.37 kg/m    Physical Exam Constitutional:      Appearance: Normal appearance.  HENT:     Head: Normocephalic and atraumatic.     Mouth/Throat:     Mouth: Mucous membranes are moist.  Eyes:     Pupils: Pupils are equal, round, and reactive to light.  Pulmonary:     Effort: Pulmonary effort is normal.  Skin:    General: Skin is warm.  Neurological:     General: No focal deficit present.     Mental Status: He is alert.      No results found for any visits on 04/22/24.  Assessment & Plan     Problem List Items Addressed This Visit       Other   Spinal stenosis of lumbosacral region - Primary   Relevant Medications   traMADol  (ULTRAM ) 50 MG tablet   traMADol  (ULTRAM ) 50 MG tablet (Start on 05/22/2024)  traMADol  (ULTRAM ) 50 MG tablet (Start on 06/21/2024)   Chronic, continuous use of opioids   Relevant Orders   Pain Mgt Scrn (14 Drugs), Ur   Other Visit Diagnoses       Immunization due       Relevant Orders   Flu vaccine trivalent PF, 6mos and older(Flulaval,Afluria,Fluarix,Fluzone) (Completed)      Assessment & Plan Chronic lumbosacral spinal stenosis Chronic back pain, well-controlled with tramadol . Symptoms controlled with 1-2x daily tramadol , preventing mobility issues.  Denies side effects.  - Refilled tramadol  for 3 months, PDMP appropriate - Will obtain Utox today - Use acetaminophen  as needed.   Return in about 6 weeks (around 06/03/2024) for Follow up with Dr. Donzella (PCP).       Isaiah DELENA Pepper, MD  [facility] [phone]  (phone) [phone] (fax)

## 2024-04-23 ENCOUNTER — Ambulatory Visit: Payer: Self-pay

## 2024-04-23 LAB — PAIN MGT SCRN (14 DRUGS), UR
Amphetamine Scrn, Ur: NEGATIVE ng/mL
BARBITURATE SCREEN URINE: NEGATIVE ng/mL
BENZODIAZEPINE SCREEN, URINE: NEGATIVE ng/mL
Buprenorphine, Urine: NEGATIVE ng/mL
CANNABINOIDS UR QL SCN: NEGATIVE ng/mL
Cocaine (Metab) Scrn, Ur: NEGATIVE ng/mL
Creatinine(Crt), U: 88.6 mg/dL (ref 20.0–300.0)
Fentanyl, Urine: NEGATIVE pg/mL
Meperidine Screen, Urine: NEGATIVE ng/mL
Methadone Screen, Urine: NEGATIVE ng/mL
OXYCODONE+OXYMORPHONE UR QL SCN: NEGATIVE ng/mL
Opiate Scrn, Ur: NEGATIVE ng/mL
Ph of Urine: 6.3 (ref 4.5–8.9)
Phencyclidine Qn, Ur: NEGATIVE ng/mL
Propoxyphene Scrn, Ur: NEGATIVE ng/mL
Tramadol Screen, Urine: POSITIVE ng/mL — AB

## 2024-05-25 ENCOUNTER — Other Ambulatory Visit: Payer: Self-pay | Admitting: Family Medicine

## 2024-05-25 DIAGNOSIS — E118 Type 2 diabetes mellitus with unspecified complications: Secondary | ICD-10-CM

## 2024-06-11 ENCOUNTER — Ambulatory Visit (INDEPENDENT_AMBULATORY_CARE_PROVIDER_SITE_OTHER): Admitting: Family Medicine

## 2024-06-11 ENCOUNTER — Encounter: Payer: Self-pay | Admitting: Family Medicine

## 2024-06-11 VITALS — BP 131/73 | HR 80 | Ht 70.0 in | Wt 315.3 lb

## 2024-06-11 DIAGNOSIS — E785 Hyperlipidemia, unspecified: Secondary | ICD-10-CM | POA: Diagnosis not present

## 2024-06-11 DIAGNOSIS — I10 Essential (primary) hypertension: Secondary | ICD-10-CM

## 2024-06-11 DIAGNOSIS — E1159 Type 2 diabetes mellitus with other circulatory complications: Secondary | ICD-10-CM

## 2024-06-11 DIAGNOSIS — E1169 Type 2 diabetes mellitus with other specified complication: Secondary | ICD-10-CM | POA: Diagnosis not present

## 2024-06-11 DIAGNOSIS — E118 Type 2 diabetes mellitus with unspecified complications: Secondary | ICD-10-CM | POA: Diagnosis not present

## 2024-06-11 DIAGNOSIS — Z6841 Body Mass Index (BMI) 40.0 and over, adult: Secondary | ICD-10-CM | POA: Diagnosis not present

## 2024-06-11 DIAGNOSIS — E559 Vitamin D deficiency, unspecified: Secondary | ICD-10-CM

## 2024-06-11 DIAGNOSIS — J309 Allergic rhinitis, unspecified: Secondary | ICD-10-CM | POA: Diagnosis not present

## 2024-06-11 DIAGNOSIS — G4733 Obstructive sleep apnea (adult) (pediatric): Secondary | ICD-10-CM

## 2024-06-11 DIAGNOSIS — I152 Hypertension secondary to endocrine disorders: Secondary | ICD-10-CM

## 2024-06-11 MED ORDER — LORATADINE 10 MG PO TABS: ORAL_TABLET | ORAL | 3 refills | Status: AC

## 2024-06-11 MED ORDER — ROSUVASTATIN CALCIUM 10 MG PO TABS: 10.0000 mg | ORAL_TABLET | Freq: Every day | ORAL | 3 refills | Status: AC

## 2024-06-11 MED ORDER — OZEMPIC (2 MG/DOSE) 8 MG/3ML ~~LOC~~ SOPN: PEN_INJECTOR | SUBCUTANEOUS | 0 refills | Status: AC

## 2024-06-11 MED ORDER — LISINOPRIL 30 MG PO TABS: 30.0000 mg | ORAL_TABLET | Freq: Every day | ORAL | 0 refills | Status: AC

## 2024-06-11 NOTE — Progress Notes (Signed)
 "     Established patient visit   Patient: John Orozco   DOB: 1961-01-22   64 y.o. Male  MRN: [id number] Visit Date: 06/11/2024  Today's healthcare provider: LAURAINE LOISE BUOY, DO   Chief Complaint  Patient presents with   Follow-up    Patient is present for 6 week follow-up   Subjective    HPI John Orozco is a 64 year old male with hypertension and diabetes who presents for a follow-up visit and medication refills.  He takes tramadol  for his back. The frequency of tramadol  use varies with weather conditions, typically one to two times a day, with increased usage during colder weather. He often forgets to take it when the weather is warm, indicating less bothersome pain.  He monitors his blood pressure at home about once a month, with readings similar to today's measurement of 131/73 mmHg. He notes that his blood pressure is sometimes lower in the evenings, around 121/62 mmHg. He is currently on lisinopril  30 mg.  He checks his blood sugar sporadically and feels he can tell when it is off. His last A1c was 5.8% in May 2025. He is on Ozempic  for diabetes management.  He is taking rosuvastatin  for cholesterol management, with his last LDL recorded at 51 mg/dL. He compares this to a friend's LDL level, who has heart problems and is on lifelong medication.  He takes vitamin D  supplements, though he is unsure of the current dosage due to variations in what his wife vitamin acquisition. His vitamin D  level was 53 ng/mL in May 2025, improved from a low of 18 ng/mL in 2020.  He uses a CPAP machine daily for sleep apnea. He reports improvement in cognitive function and leg swelling since starting CPAP therapy. He wears compression socks daily to manage leg swelling, which was previously problematic before CPAP use. He mentions a history of cognitive decline, vision issues, and decreased sex drive prior to CPAP therapy, which have since improved.  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 His wife is involved in retail, buying and reselling items, which sometimes includes supplements.      Medications: Show/hide medication list[1]       Objective    BP 131/73 (BP Location: Right Arm, Patient Position: Sitting, Cuff Size: Normal)   Pulse 80   Ht 5' 10 (1.778 m)   Wt (!) 315 lb 4.8 oz (143 kg)   SpO2 97%   BMI 45.24 kg/m     Physical Exam Vitals reviewed.  Constitutional:      General: He is not in acute distress.    Appearance: Normal appearance. He is not diaphoretic.  HENT:     Head: Normocephalic and atraumatic.  Eyes:     General: No scleral icterus.    Conjunctiva/sclera: Conjunctivae normal.  Cardiovascular:     Rate and Rhythm: Normal rate and regular rhythm.     Pulses: Normal pulses.     Heart sounds: Normal heart sounds. No murmur heard. Pulmonary:     Effort: Pulmonary effort is normal. No respiratory distress.     Breath sounds: Normal breath sounds. No wheezing or rhonchi.  Abdominal:     General: Bowel sounds are normal.     Palpations: Abdomen is soft.  Musculoskeletal:     Cervical back: Neck supple.     Right lower leg: No edema.     Left lower leg: No edema.  Lymphadenopathy:  Cervical: No cervical adenopathy.  Skin:    General: Skin is warm and dry.     Findings: No rash.     Comments: +varicose vein, discoloration to BLE.  Neurological:     Mental Status: He is alert and oriented to person, place, and time. Mental status is at baseline.  Psychiatric:        Mood and Affect: Mood normal.        Behavior: Behavior normal.      No results found for any visits on 06/11/24.  Assessment & Plan    Hypertension associated with diabetes (HCC) Assessment & Plan: Chronic, stable on lisinopril  30 mg daily. No acute concerns.  Continue to monitor.   Orders: -     Comprehensive metabolic panel with GFR  Type 2 diabetes with complication  [facility]) Assessment & Plan: A1c of 5.8% in May indicates good control. Blood sugar monitoring inconsistent. On GLP1, ACE inhibitor and statin. - Ordered A1c. - Refilled Ozempic .   Orders: -     Hemoglobin A1c -     Microalbumin / creatinine urine ratio -     Ozempic  (2 MG/DOSE); INJECT 2 MG  SUBCUTANEOUSLY ONCE A WEEK  Dispense: 3 mL; Refill: 0  Vitamin D  deficiency Assessment & Plan: Previous level 18.22 on 11/13/2018; has been stable on current supplementation.Last level 53.6 in May 2025. - Continue vitamin D  5000 IU daily.   Hyperlipidemia, unspecified hyperlipidemia type Assessment & Plan: Last LDL was 51 in May 2025. - Continue rosuvastatin  10 mg daily. - Ordered cholesterol panel.   Orders: -     Lipid Panel With LDL/HDL Ratio  Essential hypertension -     Lisinopril ; Take 1 tablet (30 mg total) by mouth daily.  Dispense: 90 tablet; Refill: 0  Hyperlipidemia associated with type 2 diabetes mellitus (HCC) -     Rosuvastatin  Calcium ; Take 1 tablet (10 mg total) by mouth daily.  Dispense: 90 tablet; Refill: 3  Allergic rhinitis, unspecified seasonality, unspecified trigger Assessment & Plan: Azelastine  nasal spray available but unused.  - Refilled loratadine .  Orders: -     Loratadine ; TAKE 1 TABLET BY MOUTH ONCE DAILY AS NEEDED FOR ALLERGIES  Dispense: 90 tablet; Refill: 3  Morbid obesity with BMI of 45.0-49.9, adult [facility]) Assessment & Plan: Noted, with recent weight gain of 8 pounds.  Counseled patient on lifestyle modifications.  Encouraged adding 5 to 10 minutes of physical activity to his daily to begin and slowly build up over time   OSA on CPAP Assessment & Plan: Compliant with CPAP nightly and deriving benefit from therapy.      Return in about 4 months (around 10/20/2024) for CPE, Chronic f/u w/Dr. Franchot.      I discussed the assessment and treatment plan with the patient  The patient was provided an opportunity to ask questions and all were answered.  The patient agreed with the plan and demonstrated an understanding of the instructions.   The patient was advised to call back or seek an in-person evaluation if the symptoms worsen or if the condition fails to improve as anticipated.    LAURAINE LOISE BUOY, DO  [facility] [phone] (phone) [phone] (fax)  [facility]    [1]  Outpatient Medications Prior to Visit  Medication Sig   Azelastine  HCl 0.15 % SOLN Place 1-2 sprays into the nose daily as needed. If 2 sprays only use 1x per day.   blood glucose meter kit and supplies Dispense  based on patient and insurance preference. Use up to four times daily as directed. (FOR ICD-10 E10.9, E11.9). 1 year lancets and strips   Insulin Pen Needle (PEN NEEDLES) 30G X 8 MM MISC 1 Device by Does not apply route once a week. Pen needles for ozempic    methocarbamol  (ROBAXIN ) 500 MG tablet Take 1 tablet (500 mg total) by mouth 2 (two) times daily.   traMADol  (ULTRAM ) 50 MG tablet Take 1 tablet (50 mg total) by mouth every 12 (twelve) hours as needed.   [START ON 06/21/2024] traMADol  (ULTRAM ) 50 MG tablet Take 1 tablet (50 mg total) by mouth every 12 (twelve) hours as needed.   [DISCONTINUED] lisinopril  (ZESTRIL ) 30 MG tablet Take 1 tablet by mouth once daily   [DISCONTINUED] loratadine  (CLARITIN ) 10 MG tablet TAKE 1 TABLET BY MOUTH ONCE DAILY AS NEEDED FOR ALLERGIES   [DISCONTINUED] rosuvastatin  (CRESTOR ) 10 MG tablet Take 1 tablet (10 mg total) by mouth daily.   [DISCONTINUED] Semaglutide , 2 MG/DOSE, (OZEMPIC , 2 MG/DOSE,) 8 MG/3ML SOPN INJECT 2 MG  SUBCUTANEOUSLY ONCE A WEEK   No facility-administered medications prior to visit.   "

## 2024-06-11 NOTE — Assessment & Plan Note (Signed)
 Chronic, stable on lisinopril  30 mg daily. No acute concerns.  Continue to monitor.

## 2024-06-11 NOTE — Assessment & Plan Note (Addendum)
 A1c of 5.8% in May indicates good control. Blood sugar monitoring inconsistent. On GLP1, ACE inhibitor and statin. - Ordered A1c. - Refilled Ozempic .

## 2024-06-11 NOTE — Assessment & Plan Note (Addendum)
 Noted, with recent weight gain of 8 pounds.  Counseled patient on lifestyle modifications.  Encouraged adding 5 to 10 minutes of physical activity to his daily to begin and slowly build up over time

## 2024-06-11 NOTE — Assessment & Plan Note (Signed)
 Last LDL was 51 in May 2025. - Continue rosuvastatin  10 mg daily. - Ordered cholesterol panel.

## 2024-06-11 NOTE — Assessment & Plan Note (Signed)
 Azelastine  nasal spray available but unused.  - Refilled loratadine .

## 2024-06-11 NOTE — Assessment & Plan Note (Signed)
 Previous level 18.22 on 11/13/2018; has been stable on current supplementation.Last level 53.6 in May 2025. - Continue vitamin D  5000 IU daily.

## 2024-06-11 NOTE — Assessment & Plan Note (Signed)
 Compliant with CPAP nightly and deriving benefit from therapy.

## 2024-06-12 LAB — COMPREHENSIVE METABOLIC PANEL WITH GFR
ALT: 23 IU/L (ref 0–44)
AST: 18 IU/L (ref 0–40)
Albumin: 4.5 g/dL (ref 3.9–4.9)
Alkaline Phosphatase: 60 IU/L (ref 47–123)
BUN/Creatinine Ratio: 18 (ref 10–24)
BUN: 19 mg/dL (ref 8–27)
Bilirubin Total: 0.4 mg/dL (ref 0.0–1.2)
CO2: 24 mmol/L (ref 20–29)
Calcium: 9.8 mg/dL (ref 8.6–10.2)
Chloride: 102 mmol/L (ref 96–106)
Creatinine, Ser: 1.06 mg/dL (ref 0.76–1.27)
Globulin, Total: 2.2 g/dL (ref 1.5–4.5)
Glucose: 95 mg/dL (ref 70–99)
Potassium: 4.5 mmol/L (ref 3.5–5.2)
Sodium: 141 mmol/L (ref 134–144)
Total Protein: 6.7 g/dL (ref 6.0–8.5)
eGFR: 79 mL/min/1.73

## 2024-06-12 LAB — HEMOGLOBIN A1C
Est. average glucose Bld gHb Est-mCnc: 117 mg/dL
Hgb A1c MFr Bld: 5.7 % — ABNORMAL HIGH (ref 4.8–5.6)

## 2024-06-12 LAB — MICROALBUMIN / CREATININE URINE RATIO
Creatinine, Urine: 71.1 mg/dL
Microalb/Creat Ratio: <4 mg/g{creat} (ref 0–29)
Microalbumin, Urine: <3 ug/mL

## 2024-06-12 LAB — LIPID PANEL WITH LDL/HDL RATIO
Cholesterol, Total: 100 mg/dL (ref 100–199)
HDL: 42 mg/dL
LDL Chol Calc (NIH): 39 mg/dL (ref 0–99)
LDL/HDL Ratio: 0.9 ratio (ref 0.0–3.6)
Triglycerides: 98 mg/dL (ref 0–149)
VLDL Cholesterol Cal: 19 mg/dL (ref 5–40)

## 2024-06-23 ENCOUNTER — Other Ambulatory Visit (HOSPITAL_COMMUNITY): Payer: Self-pay

## 2024-06-23 ENCOUNTER — Telehealth: Payer: Self-pay

## 2024-06-23 NOTE — Telephone Encounter (Signed)
 Pharmacy Patient Advocate Encounter   Received notification from Physician's Office that prior authorization for Ozempic  (2 MG/DOSE) 8MG /3ML pen-injectors  is required/requested.   Insurance verification completed.   The patient is insured through [employer].   Per test claim: PA required; PA submitted to above mentioned insurance via Latent Key/confirmation #/EOC [id number] Status is pending

## 2024-06-29 ENCOUNTER — Other Ambulatory Visit (HOSPITAL_COMMUNITY): Payer: Self-pay

## 2024-06-29 NOTE — Telephone Encounter (Signed)
 Pharmacy Patient Advocate Encounter  Received notification from [employer] that Prior Authorization for Ozempic  (2 MG/DOSE) 8MG /3ML pen-injectors has been APPROVED from 06/23/2024 to 06/28/2025. Ran test claim, Copay is $25. This test claim was processed through [facility]- copay amounts may vary at other pharmacies due to pharmacy/plan contracts, or as the patient moves through the different stages of their insurance plan.   PA #/Case ID/Reference #: [id number]

## 2024-06-30 ENCOUNTER — Ambulatory Visit: Payer: Self-pay | Admitting: Family Medicine

## 2024-07-07 ENCOUNTER — Other Ambulatory Visit (HOSPITAL_COMMUNITY): Payer: Self-pay
